# Patient Record
Sex: Male | Born: 1975 | Race: White | Hispanic: No | Marital: Married | Smoking: Former smoker
Health system: Southern US, Community
[De-identification: ages and names within clinical notes are randomized; demographics above are authoritative.]

## PROBLEM LIST (undated history)

## (undated) DIAGNOSIS — K219 Gastro-esophageal reflux disease without esophagitis: Secondary | ICD-10-CM

## (undated) DIAGNOSIS — Z6841 Body Mass Index (BMI) 40.0 and over, adult: Secondary | ICD-10-CM

## (undated) DIAGNOSIS — I319 Disease of pericardium, unspecified: Secondary | ICD-10-CM

## (undated) DIAGNOSIS — G4733 Obstructive sleep apnea (adult) (pediatric): Secondary | ICD-10-CM

## (undated) HISTORY — PX: PERICARDIAL WINDOW: SHX2213

## (undated) HISTORY — DX: Morbid (severe) obesity due to excess calories: E66.01

## (undated) HISTORY — DX: Gastro-esophageal reflux disease without esophagitis: K21.9

## (undated) HISTORY — DX: Disease of pericardium, unspecified: I31.9

## (undated) HISTORY — PX: OTHER SURGICAL HISTORY: SHX169

## (undated) HISTORY — DX: Body Mass Index (BMI) 40.0 and over, adult: Z684

## (undated) HISTORY — DX: Obstructive sleep apnea (adult) (pediatric): G47.33

---

## 2000-12-21 ENCOUNTER — Emergency Department (HOSPITAL_COMMUNITY): Admission: EM | Admit: 2000-12-21 | Discharge: 2000-12-21 | Payer: Self-pay | Admitting: Emergency Medicine

## 2000-12-21 ENCOUNTER — Encounter: Payer: Self-pay | Admitting: Emergency Medicine

## 2006-02-22 ENCOUNTER — Emergency Department (HOSPITAL_COMMUNITY): Admission: EM | Admit: 2006-02-22 | Discharge: 2006-02-23 | Payer: Self-pay | Admitting: Emergency Medicine

## 2006-09-02 ENCOUNTER — Inpatient Hospital Stay (HOSPITAL_COMMUNITY): Admission: EM | Admit: 2006-09-02 | Discharge: 2006-09-07 | Payer: Self-pay | Admitting: Emergency Medicine

## 2007-11-20 ENCOUNTER — Encounter: Payer: Self-pay | Admitting: Emergency Medicine

## 2007-11-20 ENCOUNTER — Encounter (INDEPENDENT_AMBULATORY_CARE_PROVIDER_SITE_OTHER): Payer: Self-pay | Admitting: Cardiology

## 2007-11-21 ENCOUNTER — Encounter: Payer: Self-pay | Admitting: Cardiothoracic Surgery

## 2007-11-21 ENCOUNTER — Ambulatory Visit: Payer: Self-pay | Admitting: Thoracic Surgery (Cardiothoracic Vascular Surgery)

## 2007-11-21 ENCOUNTER — Inpatient Hospital Stay (HOSPITAL_COMMUNITY): Admission: AD | Admit: 2007-11-21 | Discharge: 2007-11-24 | Payer: Self-pay | Admitting: Cardiology

## 2007-11-21 ENCOUNTER — Encounter (INDEPENDENT_AMBULATORY_CARE_PROVIDER_SITE_OTHER): Payer: Self-pay | Admitting: Cardiology

## 2007-11-30 ENCOUNTER — Ambulatory Visit: Payer: Self-pay | Admitting: Cardiothoracic Surgery

## 2007-12-05 ENCOUNTER — Ambulatory Visit: Payer: Self-pay | Admitting: Oncology

## 2007-12-07 ENCOUNTER — Ambulatory Visit: Payer: Self-pay | Admitting: Cardiothoracic Surgery

## 2007-12-07 ENCOUNTER — Encounter: Admission: RE | Admit: 2007-12-07 | Discharge: 2007-12-07 | Payer: Self-pay | Admitting: Cardiothoracic Surgery

## 2007-12-12 LAB — CBC WITH DIFFERENTIAL/PLATELET
BASO%: 0.4 % (ref 0.0–2.0)
EOS%: 6.8 % (ref 0.0–7.0)
HCT: 41.6 % (ref 38.7–49.9)
LYMPH%: 25.9 % (ref 14.0–48.0)
MCH: 28.5 pg (ref 28.0–33.4)
MCHC: 33.3 g/dL (ref 32.0–35.9)
MONO#: 0.5 10*3/uL (ref 0.1–0.9)
NEUT%: 58.9 % (ref 40.0–75.0)
Platelets: 227 10*3/uL (ref 145–400)
RBC: 4.85 10*6/uL (ref 4.20–5.71)
WBC: 6.7 10*3/uL (ref 4.0–10.0)

## 2007-12-15 LAB — COMPREHENSIVE METABOLIC PANEL
ALT: 35 U/L (ref 0–53)
AST: 20 U/L (ref 0–37)
Alkaline Phosphatase: 73 U/L (ref 39–117)
CO2: 24 mEq/L (ref 19–32)
Creatinine, Ser: 0.89 mg/dL (ref 0.40–1.50)
Sodium: 140 mEq/L (ref 135–145)
Total Bilirubin: 0.4 mg/dL (ref 0.3–1.2)
Total Protein: 7.3 g/dL (ref 6.0–8.3)

## 2007-12-15 LAB — IMMUNOFIXATION ELECTROPHORESIS
IgA: 216 mg/dL (ref 68–378)
IgG (Immunoglobin G), Serum: 1030 mg/dL (ref 694–1618)
IgM, Serum: 78 mg/dL (ref 60–263)
Total Protein, Serum Electrophoresis: 7.3 g/dL (ref 6.0–8.3)

## 2007-12-15 LAB — KAPPA/LAMBDA LIGHT CHAINS: Kappa free light chain: 1.2 mg/dL (ref 0.33–1.94)

## 2007-12-21 ENCOUNTER — Ambulatory Visit (HOSPITAL_COMMUNITY): Admission: RE | Admit: 2007-12-21 | Discharge: 2007-12-21 | Payer: Self-pay | Admitting: Oncology

## 2009-08-29 ENCOUNTER — Inpatient Hospital Stay (HOSPITAL_COMMUNITY): Admission: EM | Admit: 2009-08-29 | Discharge: 2009-09-05 | Payer: Self-pay | Admitting: Emergency Medicine

## 2009-08-31 ENCOUNTER — Encounter (INDEPENDENT_AMBULATORY_CARE_PROVIDER_SITE_OTHER): Payer: Self-pay | Admitting: Internal Medicine

## 2009-09-12 ENCOUNTER — Ambulatory Visit (HOSPITAL_COMMUNITY): Admission: RE | Admit: 2009-09-12 | Discharge: 2009-09-12 | Payer: Self-pay | Admitting: Internal Medicine

## 2009-09-19 ENCOUNTER — Ambulatory Visit (HOSPITAL_COMMUNITY): Admission: RE | Admit: 2009-09-19 | Discharge: 2009-09-19 | Payer: Self-pay | Admitting: Surgery

## 2009-09-26 ENCOUNTER — Ambulatory Visit (HOSPITAL_COMMUNITY): Admission: RE | Admit: 2009-09-26 | Discharge: 2009-09-26 | Payer: Self-pay | Admitting: Diagnostic Radiology

## 2009-10-02 ENCOUNTER — Ambulatory Visit (HOSPITAL_COMMUNITY): Admission: RE | Admit: 2009-10-02 | Discharge: 2009-10-02 | Payer: Self-pay | Admitting: Psychology

## 2009-10-14 ENCOUNTER — Ambulatory Visit (HOSPITAL_COMMUNITY): Admission: RE | Admit: 2009-10-14 | Discharge: 2009-10-14 | Payer: Self-pay | Admitting: Surgery

## 2009-10-24 ENCOUNTER — Ambulatory Visit (HOSPITAL_COMMUNITY): Admission: RE | Admit: 2009-10-24 | Discharge: 2009-10-24 | Payer: Self-pay | Admitting: Surgery

## 2009-12-05 ENCOUNTER — Encounter: Admission: RE | Admit: 2009-12-05 | Discharge: 2009-12-05 | Payer: Self-pay | Admitting: Gastroenterology

## 2010-03-29 ENCOUNTER — Encounter: Payer: Self-pay | Admitting: Cardiothoracic Surgery

## 2010-05-24 LAB — DIFFERENTIAL
Basophils Absolute: 0 10*3/uL (ref 0.0–0.1)
Basophils Absolute: 0 10*3/uL (ref 0.0–0.1)
Basophils Relative: 0 % (ref 0–1)
Eosinophils Absolute: 0 10*3/uL (ref 0.0–0.7)
Eosinophils Absolute: 0 10*3/uL (ref 0.0–0.7)
Eosinophils Absolute: 0 10*3/uL (ref 0.0–0.7)
Eosinophils Relative: 0 % (ref 0–5)
Eosinophils Relative: 0 % (ref 0–5)
Lymphocytes Relative: 6 % — ABNORMAL LOW (ref 12–46)
Lymphocytes Relative: 9 % — ABNORMAL LOW (ref 12–46)
Lymphs Abs: 1 10*3/uL (ref 0.7–4.0)
Lymphs Abs: 1.2 10*3/uL (ref 0.7–4.0)
Lymphs Abs: 1.7 10*3/uL (ref 0.7–4.0)
Monocytes Absolute: 1.2 10*3/uL — ABNORMAL HIGH (ref 0.1–1.0)
Monocytes Relative: 4 % (ref 3–12)
Neutro Abs: 14.2 10*3/uL — ABNORMAL HIGH (ref 1.7–7.7)
Neutro Abs: 17.1 10*3/uL — ABNORMAL HIGH (ref 1.7–7.7)
Neutrophils Relative %: 87 % — ABNORMAL HIGH (ref 43–77)
Neutrophils Relative %: 89 % — ABNORMAL HIGH (ref 43–77)
Neutrophils Relative %: 91 % — ABNORMAL HIGH (ref 43–77)

## 2010-05-24 LAB — BASIC METABOLIC PANEL
BUN: 10 mg/dL (ref 6–23)
BUN: 14 mg/dL (ref 6–23)
BUN: 9 mg/dL (ref 6–23)
BUN: 9 mg/dL (ref 6–23)
CO2: 24 mEq/L (ref 19–32)
CO2: 25 mEq/L (ref 19–32)
CO2: 26 mEq/L (ref 19–32)
CO2: 26 mEq/L (ref 19–32)
Calcium: 8.2 mg/dL — ABNORMAL LOW (ref 8.4–10.5)
Calcium: 8.3 mg/dL — ABNORMAL LOW (ref 8.4–10.5)
Calcium: 8.9 mg/dL (ref 8.4–10.5)
Chloride: 100 mEq/L (ref 96–112)
Chloride: 101 mEq/L (ref 96–112)
Chloride: 102 mEq/L (ref 96–112)
Chloride: 102 mEq/L (ref 96–112)
Creatinine, Ser: 0.87 mg/dL (ref 0.4–1.5)
Creatinine, Ser: 0.88 mg/dL (ref 0.4–1.5)
Creatinine, Ser: 1 mg/dL (ref 0.4–1.5)
Creatinine, Ser: 1 mg/dL (ref 0.4–1.5)
Creatinine, Ser: 1.18 mg/dL (ref 0.4–1.5)
GFR calc Af Amer: >60 mL/min (ref >60)
GFR calc Af Amer: >60 mL/min (ref >60)
GFR calc Af Amer: >60 mL/min (ref >60)
GFR calc Af Amer: >60 mL/min (ref >60)
GFR calc non Af Amer: >60 mL/min (ref >60)
GFR calc non Af Amer: >60 mL/min (ref >60)
Glucose, Bld: 107 mg/dL — ABNORMAL HIGH (ref 70–99)
Potassium: 3.5 mEq/L (ref 3.5–5.1)
Potassium: 3.8 mEq/L (ref 3.5–5.1)
Sodium: 132 mEq/L — ABNORMAL LOW (ref 135–145)
Sodium: 135 mEq/L (ref 135–145)

## 2010-05-24 LAB — CBC
HCT: 39.9 % (ref 39.0–52.0)
HCT: 40 % (ref 39.0–52.0)
Hemoglobin: 13.5 g/dL (ref 13.0–17.0)
Hemoglobin: 13.9 g/dL (ref 13.0–17.0)
Hemoglobin: 15.9 g/dL (ref 13.0–17.0)
MCH: 29.7 pg (ref 26.0–34.0)
MCH: 30 pg (ref 26.0–34.0)
MCH: 30.4 pg (ref 26.0–34.0)
MCH: 30.7 pg (ref 26.0–34.0)
MCH: 30.7 pg (ref 26.0–34.0)
MCH: 30.8 pg (ref 26.0–34.0)
MCHC: 33.4 g/dL (ref 30.0–36.0)
MCHC: 34.5 g/dL (ref 30.0–36.0)
MCV: 88.2 fL (ref 78.0–100.0)
MCV: 89 fL (ref 78.0–100.0)
MCV: 89.1 fL (ref 78.0–100.0)
MCV: 89.8 fL (ref 78.0–100.0)
Platelets: 170 10*3/uL (ref 150–400)
Platelets: 180 10*3/uL (ref 150–400)
Platelets: 211 10*3/uL (ref 150–400)
Platelets: 212 10*3/uL (ref 150–400)
Platelets: 240 10*3/uL (ref 150–400)
RBC: 4.39 MIL/uL (ref 4.22–5.81)
RBC: 4.51 MIL/uL (ref 4.22–5.81)
RBC: 4.52 MIL/uL (ref 4.22–5.81)
RBC: 4.92 MIL/uL (ref 4.22–5.81)
RDW: 13 % (ref 11.5–15.5)
RDW: 13.6 % (ref 11.5–15.5)
RDW: 14 % (ref 11.5–15.5)
WBC: 13.1 10*3/uL — ABNORMAL HIGH (ref 4.0–10.5)
WBC: 14.2 10*3/uL — ABNORMAL HIGH (ref 4.0–10.5)
WBC: 15.6 10*3/uL — ABNORMAL HIGH (ref 4.0–10.5)
WBC: 19.7 10*3/uL — ABNORMAL HIGH (ref 4.0–10.5)
WBC: 21.5 10*3/uL — ABNORMAL HIGH (ref 4.0–10.5)

## 2010-05-24 LAB — URINALYSIS, ROUTINE W REFLEX MICROSCOPIC
Glucose, UA: NEGATIVE mg/dL
Specific Gravity, Urine: 1.029 (ref 1.005–1.030)
pH: 6.5 (ref 5.0–8.0)

## 2010-05-24 LAB — COMPREHENSIVE METABOLIC PANEL
AST: 21 U/L (ref 0–37)
CO2: 25 mEq/L (ref 19–32)
Calcium: 9.1 mg/dL (ref 8.4–10.5)
Creatinine, Ser: 1.08 mg/dL (ref 0.4–1.5)
GFR calc Af Amer: >60 mL/min (ref >60)
GFR calc non Af Amer: >60 mL/min (ref >60)

## 2010-05-24 LAB — CULTURE, BLOOD (ROUTINE X 2)

## 2010-05-24 LAB — URINE CULTURE
Colony Count: NO GROWTH
Culture: NO GROWTH

## 2010-05-24 LAB — CULTURE, ROUTINE-ABSCESS

## 2010-05-24 LAB — TSH: TSH: 1.965 u[IU]/mL (ref 0.350–4.500)

## 2010-07-21 NOTE — Discharge Summary (Signed)
Ralph Cervantes, Ralph Cervantes                ACCOUNT NO.:  [id number]   MEDICAL RECORD NO.:  [id number]          PATIENT TYPE:  INP   LOCATION:  2020                         FACILITY:  [facility]   PHYSICIAN:  Cristy Hilts. Jacinto Halim, MD       DATE OF BIRTH:  August 19, 1975   DATE OF ADMISSION:  11/21/2007  DATE OF DISCHARGE:  11/24/2007                               DISCHARGE SUMMARY   DISCHARGE DIAGNOSES:  1. Large pericardial effusion by CT scan with right ventricular and      right atrial collapse by echocardiogram.  2. Status post attempted pericardiocentesis on November 21, 2007, by      Dr. Julieanne Manson.  3. Status post subxiphoid pericardial window by Dr. Tyrone Sage on      November 21, 2007, with drainage of 1500 mL of bloody pericardial      fluid.  4. Morbid obesity.   LABS:  Sodium 138, potassium 3.9, BUN 10, creatinine 0.85, rheumatoid  factor was less than 20, hemoglobin 11.8, hematocrit 35.7, WBC 7.2, and  platelets 221.  Pericardial fluid; AFB with smear showed no acid fast  bacilli; however, the cultures will be examined for 6 weeks.  Culture of  pericardial tissue.  AFB with smear showed no acid fast bacilli seen in  the culture but will be examined for 6 weeks.  Gram stain on pericardial  tissue showed no WBCs, no organisms.  Gram stain on the pericardial  fluid showed moderate WBCs present both PMN and mononuclear, no organism  seen, abundant RBCs.  ESR was 26.  TSH 1.34.  BNP was 34.  Cardiac  markers were negative.  CK-MB and troponin were negative.  Fungus  culture of the pericardial fibrin showed no yeast or fungal element  seen.  Culture in progress for 4 weeks.  Fungus culture of the  pericardial fluid also showed no yeast or fungal, culture in progress  for 4 weeks.  Tissue cultures on the pericardial tissue showed no growth  x2 days.  Culture on the pericardial fluid also showed no growth.  Pathology report showed hemorrhagic and fibrinous pericarditis with  reactive changes.   Cytology showed pericardial fluid ThinPrep and cell  block.  No malignant cells.  Radiology, chest x-ray on November 24, 2007, showed cardiomegaly, mild residual central vascular congestion  without convincing edema, no acute infiltrate, no pneumothorax.  CT of  the chest and abdomen showed a small pericardial effusion, trace  bilateral effusions, and associated compressive atelectasis.  Abdomen  showed no acute intraabdominal finding.  Chest x-ray on November 21, 2007, showed low lung volumes with lower lobe collapse, left greater  than the right and stable cardiomyopathy.  Echo on November 21, 2007,  transesophageal showed LV was small, left ventricular wall thickness was  moderately increased, no left atrial thrombus.  There was a large free-  flowing fibrin stranding pericardial effusion circumferential to the  heart, effusion dimensions 50 mm on the left and right lateral.  There  was marked right ventricular chamber collapse.  There was moderate left  ventricular chamber collapse.  He also  had a 2-D echo on November 21, 2007, that showed a moderate right atrial chamber collapse and moderate  right ventricular chamber collapse with a large pericardial effusion  circumferential to the heart.   HOSPITAL COURSE:  [name] was seen in the emergency room by Dr.  Lynnea Ferrier on November 20, 2007, with a 6- to 31-month history of lethargy  and weight gain about 60 pounds.  He apparently had gone on vacation the  week prior to his admission.  He apparently was unable to lie flat.  He  had orthopnea and nausea and vomiting the day before his admission.  He  was seen, and a transthoracic echo was done.  He was found to have a  large pericardial effusion.  His CT also showed a large pericardial  effusion.  The case was discussed with Dr. Tresa Endo.  It was decided that  he should be attempted a pericardiocentesis on November 21, 2007.  On  November 21, 2007, pericardiocentesis was attempted  by Dr. Julieanne Manson, took 4 attempts to enter the pericardium with a needle, unable  to aspirate any fluid.  Thus, it was decided he would need a pericardial  window.  CVTS was consulted.  He was seen initially by Dr. Dorris Fetch  and then by Dr. Tyrone Sage, and pericardiocentesis was discussed, and he  was willing to proceed on that.  Thus, he had a 1500 mL of bloody  pericardial fluid drained by subxiphoid pericardial window.  He did have  a mediastinal tube postsurgery which drained out about 200 mL more.  Thus, he had a total of about 1700 mL of drainage from his pericardial  effusion.  His mediastinal drain was removed on November 23, 2007.  On  November 24, 2007, he was seen by Dr. Jacinto Halim, considered stable for  discharge home.  He had been placed on ibuprofen 600 mg 3 times a day  and Protonix 40 mg once a day.  We will continue that for approximately  2 weeks until he sees Dr. Lynnea Ferrier in followup.  He will undergo 2-D  echocardiogram next week and then follow up with Dr. Lynnea Ferrier.  He will  also have an appointment to be seen to have sutures removed at Dr.  Dennie Maizes office on November 30, 2007.  He will follow up with Dr.  Tyrone Sage on December 18, 2007.   DISCHARGE MEDICATIONS:  1. Ibuprofen 600 mg 2 times a day x2 weeks.  2. Protonix 40 mg a day x2 weeks.  3. OxyContin 5 mg, he may use for pain, 1-2 every 4-6 hours as needed.      Lezlie Octave, N.P.      Cristy Hilts. Jacinto Halim, MD  Electronically Signed    BB/MEDQ  D:  11/24/2007  T:  11/25/2007  Job:  [id number]   cc:   Sheliah Plane, MD  Merlene Laughter. Renae Gloss, M.D.

## 2010-07-21 NOTE — Consult Note (Signed)
NAMEALHASSAN, EVERINGHAM                ACCOUNT NO.:  [id number]   MEDICAL RECORD NO.:  [id number]          PATIENT TYPE:  INP   LOCATION:  2302                         FACILITY:  [facility]   PHYSICIAN:  Salvatore Decent. Dorris Fetch, M.D.DATE OF BIRTH:  20-Dec-1975   DATE OF CONSULTATION:  11/21/2007  DATE OF DISCHARGE:                                 CONSULTATION   Mr. Woolsey is a 35 year old previous healthy gentleman who presents with  shortness of breath.  He does note over the past 6-8 months, he has had  a history of lethargy and had gained about 60 pounds.  He had been on  vacation at [facility] last week, and while there noted some right  upper quadrant abdominal pain and some chest discomfort, and he also  began having shortness of breath with exertion, which eventually  progressed to shortness of breath at rest and orthopnea.  The last 2  nights while at the beach, he had to sit up while sleeping.  He saw his  medical doctor, and a chest CT was performed which showed a large  pericardial effusion.  He came in today for a subxiphoid  pericardiocentesis by Dr. Clarene Duke.  A catheter was placed, but the fluid  could not be successfully evacuated.  The patient is tachycardic but  does have good blood pressure and no overt cardiac tamponade.   PAST MEDICAL HISTORY:  Unremarkable.   MEDICATIONS:  None.   ALLERGIES:  None.   FAMILY HISTORY:  Father has esophageal cancer.   SOCIAL HISTORY:  He is married with 1 child.  He is a Naval architect.  He  did smoke until 6 months ago.   REVIEW OF SYSTEMS:  See HPI, otherwise negative.   PHYSICAL EXAMINATION:  GENERAL:  Mr. Valent is an obese, 35 year old  white male in mild respiratory distress.  VITAL SIGNS:  Blood pressure is 150/80, pulse is 120 and regular, and  respirations are 20.  NEUROLOGIC:  He is alert and oriented x3 with no focal deficits.  HEENT:  Unremarkable.  NECK:  Supple without thyromegaly or adenopathy.  He does have jugular  venous distention.  CARDIAC:  Sounds are difficult to auscultate.  LUNGS:  Clear anteriorly with no wheezing.  ABDOMEN:  Soft and nontender.  EXTREMITIES:  Warm.  There is no clubbing or cyanosis.   LABORATORY DATA:  His white count is 9.7 and hematocrit 37.  BUN and  creatinine are 18 and 1.05.  Sodium 136 and potassium 4.4.  CT of the  chest was done at an outside institution.  We are trying to localize  that at the present time.  ECG shows pulsus alternans and sinus  tachycardia.   Transthoracic echo showed RV inspiratory collapse and respiratory  variation.  There was minimal right atrial collapse, and there was a  large pericardial effusion.   IMPRESSION:  Mr. Philipp is a 35 year old gentleman who presents with  recent onset shortness of breath.  He has been found to have a large  pericardial effusion, and attempts to drain percutaneously are  successful.  He needs a pericardial  window for diagnostic and  therapeutic purposes.  I discussed with him the indications, risks,  benefits, and alternatives.  He understands and accepts those risks and  agrees to proceed.  He understands the risks including bleeding,  infection, as well as risks associated with general anesthesia as well  as general risks of surgery including DVT or PE.  He does understand  there is a very slight risk of cardiac injury.  He understands and  accepts these risks and agrees to proceed.  We will post him for surgery  with Dr. Sheliah Plane who is on call today but is currently  unavailable.  If he were to develop any signs of instability, we could  proceed more emergently.  Otherwise, he would be done as the second case  today at approximately noon to 1 o'clock.      Salvatore Decent Dorris Fetch, M.D.  Electronically Signed     SCH/MEDQ  D:  11/21/2007  T:  11/21/2007  Job:  [id number]   cc:   Sheliah Plane, MD  Thereasa Solo. Little, M.D.

## 2010-07-21 NOTE — Assessment & Plan Note (Signed)
OFFICE VISIT   Ralph Cervantes, Ralph Cervantes  DOB:  15-Nov-1975                                        December 07, 2007  CHART #:  [id number]   The patient returns to the office today after an urgent subxiphoid  pericardial window and drainage of 1500 mL of bloody pericardial  effusion on November 21, 2007.  The patient now is feeling well without  any sequelae.  He is interested in returning to work next week.  The  final pathology on the pericardium pleural fluid and cytology did not  reveal any evidence of malignancy.  Although the patient does have  appointment with Oncology for further evaluation to ensure there is no  underlying malignancy even with a negative path may have been a cause of  pericardial effusion.  The patient does note today that his family  history is positive for ITP, and his father who subsequently died of  esophageal cancer at age 82.   PHYSICAL EXAMINATION:  VITAL SIGNS:  His blood pressure 140/94, pulse is  94, respiratory rate is 20, and O2 sat is 94% on room air.  He had some small amount of serous drainage from his wound last week and  returns today for recheck of the wounds.  The subxiphoid incision is now  healing well without any evidence of infection or drainage.  LUNGS:  Clear bilaterally.   Followup chest x-ray shows the small pleural effusions, which had been  present and have resolved.  He has no infiltrates or effusions.   I have encouraged him to check with his primary medical doctor and make  sure of his flu vaccinations up-to-date.  I have not made him a specific  return appointment to see me, but would be glad to see him at his  cardiologist's request.   Sheliah Plane, MD  Electronically Signed   EG/MEDQ  D:  12/07/2007  T:  12/07/2007  Job:  [id number]   cc:   Merlene Laughter. Renae Gloss, M.D.  Ritta Slot, MD

## 2010-07-21 NOTE — Assessment & Plan Note (Signed)
OFFICE VISIT   Ralph Cervantes, Ralph Cervantes  DOB:  06-25-1975                                        November 30, 2007  CHART #:  [id number]   The patient returns to the office today for followup on his wound check  on his subxiphoid pericardial window done approximately 1 week ago.  He  returns today because of some drainage from the midportion of his  incision.  He has had no fever or chills.  Overall, he feels well and is  actually interested in returning to work next week.   PHYSICAL EXAMINATION:  VITAL SIGNS:  His blood pressure 144/91, pulse is  106, respiratory rate is 18, and O2 sats 93%.  SKIN:  The Dermabond on his incision is gradually peeling off.  There is  no erythema.  There is no drainage.  There is a very small amount of  serous drainage spotting from the midportion of incision.  His wound was  clean.  It is intact and without obvious infection.   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  He was given  instructions in wound care and we will plan to see him back in 1 week.  He is aware if he has increasing drainage or any fevers, chills, or  erythema of the wound to contact us immediately.   Sheliah Plane, MD  Electronically Signed   EG/MEDQ  D:  11/30/2007  T:  12/01/2007  Job:  [id number]   cc:   Ritta Slot, MD

## 2010-07-21 NOTE — Op Note (Signed)
Ralph Cervantes, Ralph Cervantes                ACCOUNT NO.:  [id number]   MEDICAL RECORD NO.:  [id number]          PATIENT TYPE:  INP   LOCATION:  2302                         FACILITY:  [facility]   PHYSICIAN:  Sheliah Plane, MD    DATE OF BIRTH:  1975-10-29   DATE OF PROCEDURE:  11/21/2007  DATE OF DISCHARGE:                               OPERATIVE REPORT   PREOPERATIVE DIAGNOSIS:  Pericardial tamponade and large pericardial  effusion.   POSTOPERATIVE DIAGNOSIS:  Pericardial tamponade and large pericardial  effusion with drainage.   PROCEDURE PERFORMED:  Subxiphoid pericardial window and drainage of  pericardial effusion approximately 1500 mL of bloody effusion.   SURGEON:  Sheliah Plane, MD   FIRST ASSISTANT:  Theda Belfast, [place]   BRIEF HISTORY:  The patient is a 35 year old male who presents with  several-week history of increasing heart failure symptoms and difficulty  sleeping.  Enlarged cardiac silhouette on chest x-ray.  Echocardiogram  showed large pericardial effusion with tamponade physiology.  Cardiology  attempted pericardiocentesis which was successful possibly related to  the patient's very large size of almost 390 pounds.  Subxiphoid  pericardial window was recommended urgently after failed  pericardiocentesis.  The patient agreed and signed informed consent.   DESCRIPTION OF PROCEDURE:  The patient underwent general endotracheal  anesthesia without incident.  Transesophageal echo probe was placed and  confirmed the diagnosis.  The chest was prepped with Betadine and draped  in the usual sterile manner.  An incision was made over the xiphoid  process.  Dissection was carried down through the deep subcutaneous  tissue.  The xiphoid was excised.  The lower sternum was elevated with a  retractor and with some difficulty the pericardium was identified.  Pericardium was opened and 1500 mL of bloody pericardial effusion were  evacuated.  The TEE confirmed complete removal of  the fluid.  A Blake  drain was placed inferiorly in the pericardium and brought out through a  separate stab wound.  Portion of the pericardium and proteinaceous  debris on the epicardial surface were submitted to pathology.  Cultures  were obtained.  A large amount of pericardial fluid was submitted for  cytology.  The fascia was closed with interrupted 0 Vicryl, subcutaneous  tissue, and running 2-0 Vicryl and a 3-0 subcuticular stitch in the skin  edges.  Dry dressings were  applied.  Sponge and needle count was reported as correct after  completion of the procedure.  The patient tolerated the procedure  without obvious complication and was extubated and transferred to the  recovery room for postoperative care.      Sheliah Plane, MD  Electronically Signed     EG/MEDQ  D:  11/22/2007  T:  11/23/2007  Job:  [id number]   cc:   Thereasa Solo. Little, M.D.  Ritta Slot, MD

## 2010-07-21 NOTE — H&P (Signed)
Ralph Cervantes, Ralph Cervantes                ACCOUNT NO.:  [id number]   MEDICAL RECORD NO.:  [id number]          PATIENT TYPE:  INP   LOCATION:  1424                         FACILITY:  [facility]   PHYSICIAN:  Ollen Gross. Vernell Morgans, M.D. DATE OF BIRTH:  [birth year]   DATE OF ADMISSION:  09/02/2006  DATE OF DISCHARGE:                              HISTORY & PHYSICAL   HISTORY OF PRESENT ILLNESS:  Ralph Cervantes is a 34 year old, white male who  presented to the emergency department today with abdominal pain that has  been lasting the last 2 days.  He states the pain is mostly in his right  lower quadrant.  It has not really been associated with any nausea or  vomiting, but he has run fevers to 103 and has had some diarrhea over  the last couple of days.  He otherwise denies any fevers, chills, chest  pain, shortness of breath or dysuria.  His other review of systems are  unremarkable.   PAST MEDICAL HISTORY:  Obesity.   PAST SURGICAL HISTORY:  None.   MEDICATIONS:  None.   ALLERGIES:  No known drug allergies.   SOCIAL HISTORY:  He denies use of alcohol or tobacco products.   FAMILY HISTORY:  Noncontributory.   PHYSICAL EXAMINATION:  VITAL SIGNS:  Temperature 103.5, blood pressure  138/91, pulse 130.  GENERAL:  Well-developed, well-nourished, obese, white male in no acute  distress.  SKIN:  Warm and dry, no jaundice.  HEENT:  Extraocular movements intact.  Pupils equal round and reactive  to light.  Sclerae nonicteric.  LUNGS:  Clear bilaterally with no use of accessory or respiratory  muscles.  HEART:  Regular rate and rhythm with an impulse in left chest.  ABDOMEN:  Soft.  He has only focal tenderness in the right lower  quadrant, but no generalized peritonitis with slight guarding just in  the right lower quadrant.  No palpable mass or hepatosplenomegaly.  His  abdomen is obese.  EXTREMITIES:  No clubbing, cyanosis or edema with good strength in his  arms and legs.  NEUROLOGIC:  Alert and  oriented x3 with no unstable anxiety or  depression.   LABORATORY DATA AND X-RAY FINDINGS:  White count 20,100.   In reviewing his CT scan with the radiologist, it does appear as though  he has some sigmoid diverticulitis with a contained perforation with a  small bit of air outside the colon wall.  No abscess, no inflammation  around the appendix.   ASSESSMENT/PLAN:  This is a 35 year old, white male with what appears to  be complicated sigmoid diverticulitis with a small contained  perforation.  Given his lack of abdominal pain, I suspect that he may  improve on antibiotics without any urgent surgery.  I have talked to him  about this and he is in agreement with this treatment plan.  We will  plan to admit him for bowel rest and start him on Cipro and Flagyl.  Will follow him closely including serial exams and white counts and  temperatures.  If he does improve, than he may need further colon work  up and possible surgery in 6-8 weeks when the infection as settled down.  If he does not improve, then he may need exploration, which would  probably include a colostomy.  I have talked to him about all this  treatment plan and he is in agreement and understands.      Ollen Gross. Vernell Morgans, M.D.  Electronically Signed     PST/MEDQ  D:  09/02/2006  T:  09/03/2006  Job:  [id number]

## 2010-07-21 NOTE — Cardiovascular Report (Signed)
NAMEROBEL, WUERTZ                ACCOUNT NO.:  [id number]   MEDICAL RECORD NO.:  [id number]          PATIENT TYPE:  INP   LOCATION:  2302                         FACILITY:  [facility]   PHYSICIAN:  Thereasa Solo. Little, M.D. DATE OF BIRTH:  [birth year]   DATE OF PROCEDURE:  11/21/2007  DATE OF DISCHARGE:                            CARDIAC CATHETERIZATION   INDICATIONS FOR TEST:  This obese 35 year old male presented with  increasing shortness of breath.  He had been sitting up to sleep for  several days and has had marked breathlessness for several weeks.  A CT  of his chest performed last night at [facility] showed a  massive pericardial effusion.  Echocardiography showed right ventricular  and right atrial collapse, but despite this his pressure was 150/100.   PROCEDURE NOTE:  After obtaining informed consent, the patient was  prepped and draped in the usual sterile fashion exposing the subxiphoid  area.  Following 1% Xylocaine, a long pericardiocentesis needle was  advanced.  It took 4 attempts to enter the pericardium and it was at the  hub of the needle.  Once the pericardium was entered and fluid was  removed, a long J-wire was placed into the pericardium.  A 5-French  pigtail catheter was then advanced over the guidewire into the  pericardium.  Once the pigtail catheter was in place, it would not  aspirate.  I could flush with no difficulty but could not aspirate  through the catheter.  The guidewire was then re-advanced through the  pigtail catheter into the pericardium and 8-French dilator was used and  then a 6-French straight catheter with multiple holes were placed over  the wire.  Despite this stiffer catheter that appeared to be in the  pericardium by fluoroscopy, I was still unable to aspirate.   A 3 mL of contrast was injected.  There was some myocardial stain, but  there was also contrast noted in the pericardial effusion.  Despite  this, I could not aspirate.   The catheter was then removed.  The patient  will need a pericardial window and I have talked to CVTS.  He is  hemodynamically stable.  He is having no more shortness of breath  following the procedure than he did before the procedure.  His blood  pressure is in the 130s-150 systolic and his heart rate is sinus tach at  a rate of about 120-125.   A portable chest x-ray is pending at this time.           ______________________________  Thereasa Solo Little, M.D.     ABL/MEDQ  D:  11/21/2007  T:  11/21/2007  Job:  [id number]   cc:   Ritta Slot, MD  Merlene Laughter. Renae Gloss, M.D.  CVTS  Cath Lab

## 2010-07-24 NOTE — Discharge Summary (Signed)
NAMESENG, LARCH                ACCOUNT NO.:  [id number]   MEDICAL RECORD NO.:  [id number]          PATIENT TYPE:  INP   LOCATION:  1424                         FACILITY:  [facility]   PHYSICIAN:  Ollen Gross. Vernell Morgans, M.D. DATE OF BIRTH:  07/29/1975   DATE OF ADMISSION:  09/02/2006  DATE OF DISCHARGE:  09/07/2006                               DISCHARGE SUMMARY   Mr. Ralph Cervantes is a 35 year old white male who presented with lower abdominal  pain and fever, diarrhea.  He had a white count of 20,000.  He had a CT  scan that showed sigmoid diverticulitis with a small area of sort of  microperforation.  His temperature was 103.5.  He did not, however, have  guarding or peritonitis.  He was started on broad-spectrum antibiotic  therapy and bowel rest, and he improved dramatically over the next  several days.  We had changed his antibiotics at one point to Avelox  because of some reaction to the Cipro and Flagyl, and he tolerated this  as well.  We slowly advanced his diet until he was on a low-residue diet  and on July 2 he was ready for discharge home.  His activity is as  tolerated.   MEDICATIONS:  He was given a prescription for Avelox for antibiotics.   FOLLOW-UP:  Dr. Carolynne Edouard in 2 weeks.   DIET:  A low-residue diet.   ACTIVITY:  Advance as tolerated.  Follow up with Dr. Carolynne Edouard in 2 weeks.      Ollen Gross. Vernell Morgans, M.D.  Electronically Signed     PST/MEDQ  D:  09/27/2006  T:  09/27/2006  Job:  [id number]

## 2010-12-07 LAB — BODY FLUID CULTURE: Culture: NO GROWTH

## 2010-12-07 LAB — COMPREHENSIVE METABOLIC PANEL
ALT: 87 — ABNORMAL HIGH
AST: 46 — ABNORMAL HIGH
Albumin: 2.7 — ABNORMAL LOW
Alkaline Phosphatase: 58
BUN: 10
CO2: 30
Calcium: 8.3 — ABNORMAL LOW
Chloride: 102
Creatinine, Ser: 0.83
GFR calc Af Amer: >60
GFR calc non Af Amer: >60
Glucose, Bld: 114 — ABNORMAL HIGH
Potassium: 3.9
Sodium: 138
Total Bilirubin: 1.1
Total Protein: 5.7 — ABNORMAL LOW

## 2010-12-07 LAB — GLUCOSE, CAPILLARY: Glucose-Capillary: 95

## 2010-12-07 LAB — POCT I-STAT 7, (LYTES, BLD GAS, ICA,H+H)
Bicarbonate: 27.1 — ABNORMAL HIGH
HCT: 33 — ABNORMAL LOW
Hemoglobin: 11.2 — ABNORMAL LOW
Patient temperature: 37.3
pCO2 arterial: 48.7 — ABNORMAL HIGH
pH, Arterial: 7.356
pO2, Arterial: 125 — ABNORMAL HIGH

## 2010-12-07 LAB — CBC
HCT: 33.1 — ABNORMAL LOW
HCT: 35.7 — ABNORMAL LOW
Hemoglobin: 11.1 — ABNORMAL LOW
Hemoglobin: 11.8 — ABNORMAL LOW
MCHC: 33
MCHC: 33.5
MCV: 88.5
Platelets: 221
RBC: 3.76 — ABNORMAL LOW
RBC: 4.03 — ABNORMAL LOW
RDW: 13.2
RDW: 13.6
WBC: 7.2

## 2010-12-07 LAB — TYPE AND SCREEN
ABO/RH(D): B NEG
Antibody Screen: NEGATIVE

## 2010-12-07 LAB — ANTI-NEUTROPHIL ANTIBODY

## 2010-12-07 LAB — BASIC METABOLIC PANEL
CO2: 25
CO2: 30
Chloride: 104
Creatinine, Ser: 0.85
GFR calc Af Amer: >60
GFR calc Af Amer: >60
GFR calc non Af Amer: >60
Glucose, Bld: 104 — ABNORMAL HIGH
Potassium: 3.3 — ABNORMAL LOW
Sodium: 137
Sodium: 138

## 2010-12-07 LAB — POCT I-STAT 3, ART BLOOD GAS (G3+)
Acid-Base Excess: 4 — ABNORMAL HIGH
O2 Saturation: 97
TCO2: 30
pO2, Arterial: 95

## 2010-12-07 LAB — AFB CULTURE WITH SMEAR (NOT AT ARMC)

## 2010-12-07 LAB — GRAM STAIN: Gram Stain: NONE SEEN

## 2010-12-07 LAB — TISSUE CULTURE: Culture: NO GROWTH

## 2010-12-07 LAB — ANA: Anti Nuclear Antibody(ANA): NEGATIVE

## 2010-12-07 LAB — RHEUMATOID FACTOR: Rhuematoid fact SerPl-aCnc: <20

## 2010-12-07 LAB — FUNGUS CULTURE W SMEAR: Fungal Smear: NONE SEEN

## 2010-12-07 LAB — APTT: aPTT: 34

## 2010-12-07 LAB — SEDIMENTATION RATE: Sed Rate: 26 — ABNORMAL HIGH

## 2010-12-07 LAB — EXTRACTABLE NUCLEAR ANTIGEN ANTIBODY
ENA SM Ab Ser-aCnc: <0.2 AI (ref <1.0)
SSA (Ro) (ENA) Antibody, IgG: <0.2 AI (ref <1.0)
ds DNA Ab: 1 IU/mL (ref <5)

## 2010-12-09 LAB — CBC
MCHC: 33.3
MCV: 88.2
Platelets: 258
WBC: 9.7

## 2010-12-09 LAB — URINALYSIS, ROUTINE W REFLEX MICROSCOPIC
Glucose, UA: NEGATIVE
Ketones, ur: 15 — AB
Leukocytes, UA: NEGATIVE
Nitrite: NEGATIVE
Protein, ur: 100 — AB
pH: 6

## 2010-12-09 LAB — COMPREHENSIVE METABOLIC PANEL
ALT: 146 — ABNORMAL HIGH
AST: 102 — ABNORMAL HIGH
Albumin: 3.4 — ABNORMAL LOW
Calcium: 8.9
Creatinine, Ser: 1.05
GFR calc Af Amer: >60
Sodium: 136

## 2010-12-09 LAB — DIFFERENTIAL
Eosinophils Relative: 1
Lymphocytes Relative: 20
Lymphs Abs: 1.9
Monocytes Absolute: 1.1 — ABNORMAL HIGH
Monocytes Relative: 12

## 2010-12-09 LAB — T4, FREE: Free T4: 1.62

## 2010-12-09 LAB — CK TOTAL AND CKMB (NOT AT ARMC)
CK, MB: 1.1
Relative Index: 0.6
Total CK: 176

## 2010-12-09 LAB — TROPONIN I: Troponin I: 0.01

## 2010-12-09 LAB — URINE MICROSCOPIC-ADD ON

## 2010-12-09 LAB — TSH: TSH: 1.434

## 2010-12-09 LAB — POCT CARDIAC MARKERS: Troponin i, poc: <0.05

## 2010-12-22 LAB — CBC
HCT: 36.5 — ABNORMAL LOW
Hemoglobin: 12.9 — ABNORMAL LOW
MCHC: 35.3
MCV: 88.2
RBC: 4 — ABNORMAL LOW
RDW: 13.5
WBC: 7.8

## 2010-12-22 LAB — DIFFERENTIAL
Basophils Absolute: 0
Basophils Relative: 0
Eosinophils Absolute: 0
Eosinophils Relative: 1
Lymphs Abs: 1.3
Monocytes Absolute: 1.1 — ABNORMAL HIGH
Monocytes Relative: 8
Neutro Abs: 5.7
Neutrophils Relative %: 74

## 2010-12-22 LAB — BASIC METABOLIC PANEL
CO2: 28
Chloride: 103
Creatinine, Ser: 0.87
GFR calc Af Amer: >60
Glucose, Bld: 106 — ABNORMAL HIGH
Potassium: 3.8
Sodium: 136
Sodium: 137

## 2010-12-23 LAB — CBC
HCT: 36.4 — ABNORMAL LOW
HCT: 42.3
Hemoglobin: 14.1
Hemoglobin: 15
MCHC: 34
MCHC: 34.5
MCHC: 35.4
MCV: 87.7
MCV: 88.8
Platelets: 168
Platelets: 206
RBC: 4.09 — ABNORMAL LOW
RBC: 4.82
RDW: 12.7
RDW: 13.2
WBC: 21.2 — ABNORMAL HIGH
WBC: 9

## 2010-12-23 LAB — BASIC METABOLIC PANEL
BUN: 10
BUN: 11
BUN: 13
CO2: 25
CO2: 26
Calcium: 8.5
Calcium: 8.6
Calcium: 9.1
Chloride: 99
Creatinine, Ser: 0.87
Creatinine, Ser: 1.04
GFR calc Af Amer: >60
GFR calc Af Amer: >60
GFR calc non Af Amer: >60
GFR calc non Af Amer: >60
GFR calc non Af Amer: >60
Glucose, Bld: 107 — ABNORMAL HIGH
Glucose, Bld: 122 — ABNORMAL HIGH
Glucose, Bld: 131 — ABNORMAL HIGH
Potassium: 3.8
Potassium: 3.8
Sodium: 135

## 2010-12-23 LAB — DIFFERENTIAL
Basophils Absolute: 0
Basophils Absolute: 0
Basophils Relative: 0
Basophils Relative: 0
Basophils Relative: 0
Eosinophils Absolute: 0
Eosinophils Absolute: 0
Eosinophils Absolute: 0
Eosinophils Relative: 0
Eosinophils Relative: 0
Lymphocytes Relative: 12
Lymphs Abs: 1.4
Monocytes Absolute: 1.6 — ABNORMAL HIGH
Monocytes Absolute: 1.7 — ABNORMAL HIGH
Monocytes Relative: 7
Monocytes Relative: 8
Monocytes Relative: 8
Neutro Abs: 17.4 — ABNORMAL HIGH
Neutro Abs: 17.6 — ABNORMAL HIGH
Neutro Abs: 6.9
Neutrophils Relative %: 77

## 2010-12-23 LAB — URINALYSIS, ROUTINE W REFLEX MICROSCOPIC
Ketones, ur: 40 — AB
Protein, ur: >300 — AB
Urobilinogen, UA: 1

## 2010-12-23 LAB — URINE MICROSCOPIC-ADD ON

## 2011-10-11 ENCOUNTER — Ambulatory Visit: Payer: BC Managed Care – PPO

## 2011-10-11 ENCOUNTER — Ambulatory Visit (INDEPENDENT_AMBULATORY_CARE_PROVIDER_SITE_OTHER): Payer: BC Managed Care – PPO | Admitting: Family Medicine

## 2011-10-11 VITALS — BP 136/92 | HR 77 | Temp 98.7°F | Resp 14 | Ht 68.5 in | Wt 377.0 lb

## 2011-10-11 DIAGNOSIS — R109 Unspecified abdominal pain: Secondary | ICD-10-CM

## 2011-10-11 DIAGNOSIS — R631 Polydipsia: Secondary | ICD-10-CM

## 2011-10-11 DIAGNOSIS — R300 Dysuria: Secondary | ICD-10-CM

## 2011-10-11 DIAGNOSIS — R358 Other polyuria: Secondary | ICD-10-CM

## 2011-10-11 DIAGNOSIS — R3589 Other polyuria: Secondary | ICD-10-CM

## 2011-10-11 LAB — POCT CBC
Granulocyte percent: 66.1 % (ref 37–80)
HCT, POC: 51.7 % (ref 43.5–53.7)
Hemoglobin: 15.8 g/dL (ref 14.1–18.1)
Lymph, poc: 2.2 (ref 0.6–3.4)
MCH, POC: 28.4 pg (ref 27–31.2)
MCHC: 30.6 g/dL — AB (ref 31.8–35.4)
MCV: 92.9 fL (ref 80–97)
MID (cbc): 0.6 (ref 0–0.9)
MPV: 8.8 fL (ref 0–99.8)
POC Granulocyte: 5.6 (ref 2–6.9)
POC LYMPH PERCENT: 26.5 %L (ref 10–50)
POC MID %: 7.4 %M (ref 0–12)
Platelet Count, POC: 260 10*3/uL (ref 142–424)
RBC: 5.56 M/uL (ref 4.69–6.13)
RDW, POC: 13.9 %
WBC: 8.4 10*3/uL (ref 4.6–10.2)

## 2011-10-11 LAB — POCT UA - MICROSCOPIC ONLY
Casts, Ur, LPF, POC: NEGATIVE
Crystals, Ur, HPF, POC: NEGATIVE
Epithelial cells, urine per micros: NEGATIVE
Yeast, UA: NEGATIVE

## 2011-10-11 LAB — POCT URINALYSIS DIPSTICK
Bilirubin, UA: NEGATIVE
Blood, UA: NEGATIVE
Glucose, UA: NEGATIVE
Ketones, UA: NEGATIVE
Leukocytes, UA: NEGATIVE
Nitrite, UA: NEGATIVE
Protein, UA: NEGATIVE
Spec Grav, UA: >=1.03
Urobilinogen, UA: 0.2
pH, UA: 6

## 2011-10-11 LAB — POCT GLYCOSYLATED HEMOGLOBIN (HGB A1C): Hemoglobin A1C: 5.3

## 2011-10-11 NOTE — Progress Notes (Signed)
[facility]:  Office Visit  Chief Complaint:  Chief Complaint  Patient presents with  . Abdominal Pain    dysuria, pressure  incomplete emptying 3 days    HPI: Ralph Cervantes is a 36 y.o. male who complains of  abdominal pressure, and dysuria and incomplete emptying x 3 days. H/o colon perforation with colon abscess. He is worried he is getting that again. Last time indicated through xray with free gas in it.   Colonoscopy was normal 2008   Past Medical History  Diagnosis Date  . GERD (gastroesophageal reflux disease)   . Pericarditis    Past Surgical History  Procedure Date  . Colon aspiration     abscess in colon aspirated   . Pericardial window    History   Social History  . Marital Status: Married    Spouse Name: N/A    Number of Children: N/A  . Years of Education: N/A   Social History Main Topics  . Smoking status: Former Games developer  . Smokeless tobacco: None  . Alcohol Use: Yes  . Drug Use: No  . Sexually Active: None   Other Topics Concern  . None   Social History Narrative  . None   Family History  Problem Relation Age of Onset  . Cancer Father    No Known Allergies Prior to Admission medications   Medication Sig Start Date End Date Taking? Authorizing Provider  doxycycline (DORYX) 100 MG DR capsule Take 100 mg by mouth daily.   Yes Historical Provider, MD  omeprazole (PRILOSEC) 40 MG capsule Take 40 mg by mouth daily.   Yes Historical Provider, MD  testosterone (ANDROGEL) 50 MG/5GM GEL Place 5 g onto the skin daily.   Yes Historical Provider, MD     ROS: The patient denies fevers, chills, night sweats, unintentional weight loss, chest pain, palpitations, wheezing, dyspnea on exertion, nausea, vomiting, abdominal pain, dysuria, hematuria, melena, numbness, weakness, or tingling.   All other systems have been reviewed and were otherwise negative with the exception of those mentioned in the HPI and as above.    PHYSICAL  EXAM: Filed Vitals:   10/11/11 1329  BP: 136/92  Pulse: 77  Temp: 98.7 F (37.1 C)  Resp: 14   Filed Vitals:   10/11/11 1329  Height: 5' 8.5" (1.74 m)  Weight: 377 lb (171.006 kg)   Body mass index is 56.49 kg/(m^2).  General: Alert, no acute distress, morbidly obese HEENT:  Normocephalic, atraumatic, oropharynx patent.  Cardiovascular:  Regular rate and rhythm, no rubs murmurs or gallops.  No Carotid bruits, radial pulse intact. No pedal edema.  Respiratory: Clear to auscultation bilaterally.  No wheezes, rales, or rhonchi.  No cyanosis, no use of accessory musculature GI: No organomegaly, abdomen is soft and non-tender, positive bowel sounds.  No masses. + Umbilical hernai, + obese pannus Skin: No rashes. Neurologic: Facial musculature symmetric. Psychiatric: Patient is appropriate throughout our interaction. Lymphatic: No cervical lymphadenopathy Musculoskeletal: Gait intact.   LABS: Results for orders placed in visit on 10/11/11  POCT CBC      Component Value Range   WBC 8.4  4.6 - 10.2 K/uL   Lymph, poc 2.2  0.6 - 3.4   POC LYMPH PERCENT 26.5  10 - 50 %L   MID (cbc) 0.6  0 - 0.9   POC MID % 7.4  0 - 12 %M   POC Granulocyte 5.6  2 - 6.9   Granulocyte percent 66.1  37 -  80 %G   RBC 5.56  4.69 - 6.13 M/uL   Hemoglobin 15.8  14.1 - 18.1 g/dL   HCT, POC 16.1  09.6 - 53.7 %   MCV 92.9  80 - 97 fL   MCH, POC 28.4  27 - 31.2 pg   MCHC 30.6 (*) 31.8 - 35.4 g/dL   RDW, POC 04.5     Platelet Count, POC 260  142 - 424 K/uL   MPV 8.8  0 - 99.8 fL  POCT UA - MICROSCOPIC ONLY      Component Value Range   WBC, Ur, HPF, POC 0-2     RBC, urine, microscopic 0-2     Bacteria, U Microscopic trace     Mucus, UA 3+     Epithelial cells, urine per micros neg     Crystals, Ur, HPF, POC neg     Casts, Ur, LPF, POC neg     Yeast, UA neg    POCT URINALYSIS DIPSTICK      Component Value Range   Color, UA amber     Clarity, UA clear     Glucose, UA neg     Bilirubin, UA neg      Ketones, UA neg     Spec Grav, UA >=1.030     Blood, UA neg     pH, UA 6.0     Protein, UA neg     Urobilinogen, UA 0.2     Nitrite, UA neg     Leukocytes, UA Negative    POCT GLYCOSYLATED HEMOGLOBIN (HGB A1C)      Component Value Range   Hemoglobin A1C 5.3       EKG/XRAY:   Primary read interpreted by Dr. Conley Rolls at [facility]. No free air No obstruction No pneumothorax/infiltrates   ASSESSMENT/PLAN: Encounter Diagnoses  Name Primary?  . Abdominal pain Yes  . Dysuria   . Polydipsia   . Polyuria     Patient wanted confirmation that he is doing well, he is anxious about repeat episode of free gas due to perforated colon. Encourage fluid intake, cranberry juice.  Await CMP.  F/u prn   Belinda Schlichting PHUONG, DO 10/11/2011 2:31 PM

## 2011-10-12 ENCOUNTER — Telehealth: Payer: Self-pay | Admitting: Family Medicine

## 2011-10-12 LAB — COMPREHENSIVE METABOLIC PANEL WITH GFR
ALT: 30 U/L (ref 0–53)
AST: 20 U/L (ref 0–37)
Albumin: 4.3 g/dL (ref 3.5–5.2)
BUN: 18 mg/dL (ref 6–23)
Calcium: 9.4 mg/dL (ref 8.4–10.5)
Chloride: 104 meq/L (ref 96–112)
Potassium: 4.4 meq/L (ref 3.5–5.3)

## 2011-10-12 LAB — COMPREHENSIVE METABOLIC PANEL
Alkaline Phosphatase: 72 U/L (ref 39–117)
CO2: 29 mEq/L (ref 19–32)
Creat: 0.96 mg/dL (ref 0.50–1.35)
Glucose, Bld: 90 mg/dL (ref 70–99)
Sodium: 141 mEq/L (ref 135–145)
Total Bilirubin: 0.5 mg/dL (ref 0.3–1.2)
Total Protein: 7.3 g/dL (ref 6.0–8.3)

## 2011-10-12 NOTE — Telephone Encounter (Signed)
Spoke with  him re: test results

## 2012-05-19 IMAGING — CT CT ABD-PELV W/ CM
2 of 5 series · 17 of 46 positions shown, 19 images · IV contrast (agent unspecified)
Comparison: 09/12/2009

CLINICAL DATA: Follow-up mesenteric abscess.  Perforated
diverticulitis.

CT ABDOMEN AND PELVIS WITH CONTRAST
TECHNIQUE: Multidetector CT imaging of the abdomen and pelvis was
performed following the standard protocol during bolus
administration of intravenous contrast.
Contrast: 129 ml Cmnipaque-0PP and oral contrast

[Series 2: rtn a/p with xxl · axial · 0.98mm/px · z∈[-530,-90]mm · 14 of 100 slices shown, 16 images]
[im 6/100  soft-tissue]
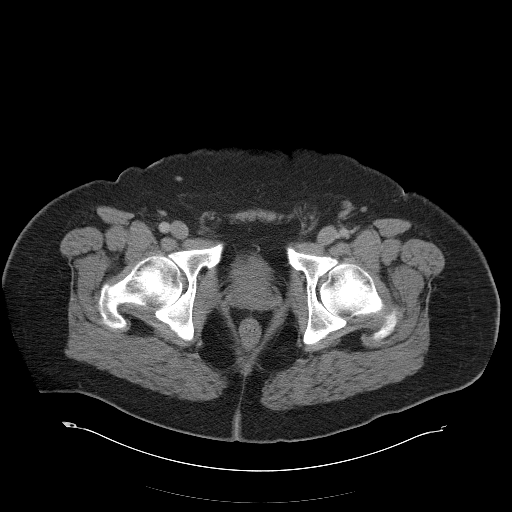
[im 6/100  bone]
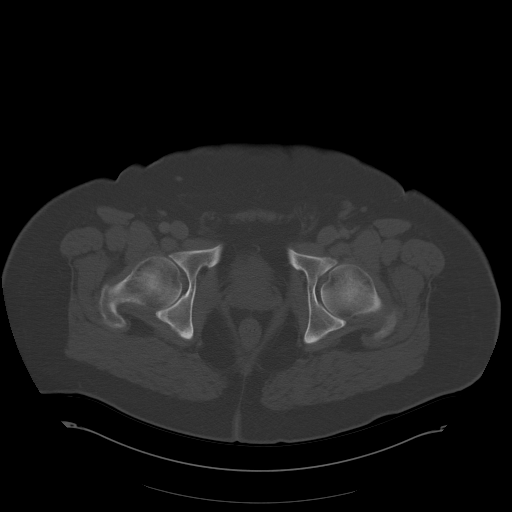
[im 11/100  soft-tissue]
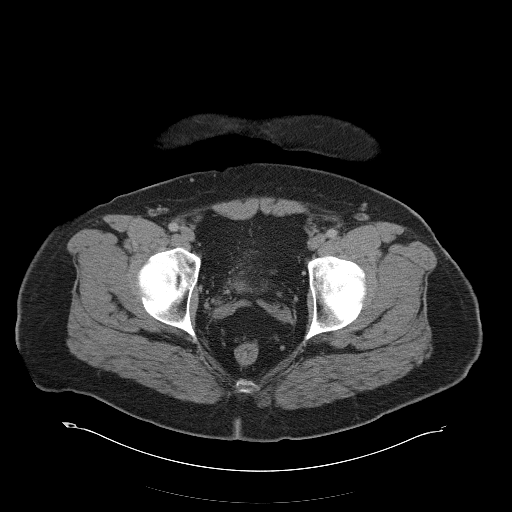
[im 21/100  soft-tissue]
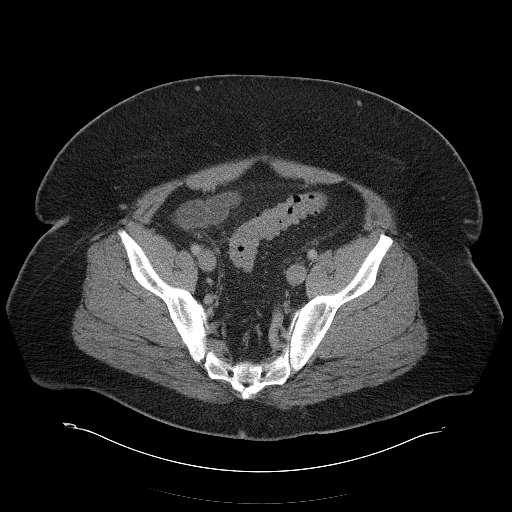
[im 27/100  soft-tissue]
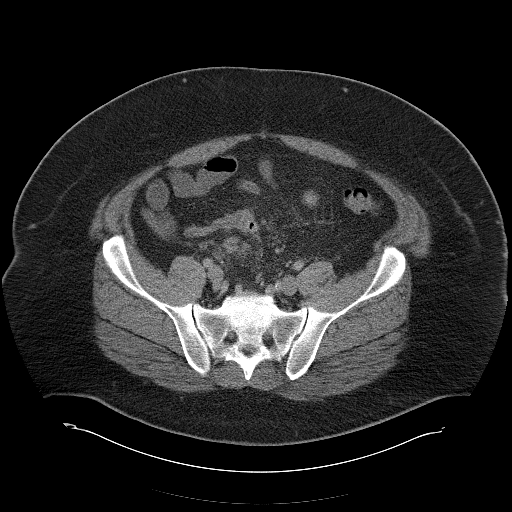
[im 32/100  soft-tissue]
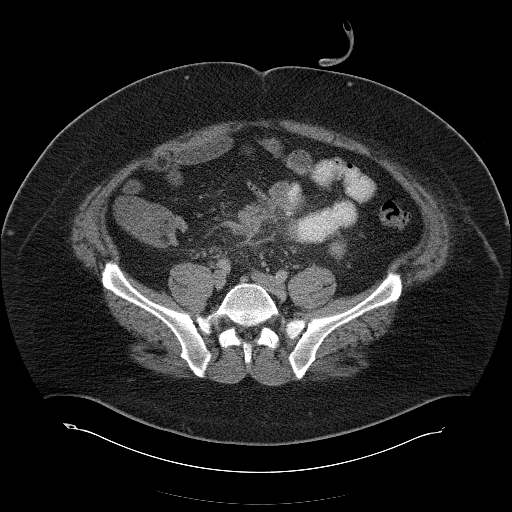
[im 42/100  soft-tissue]
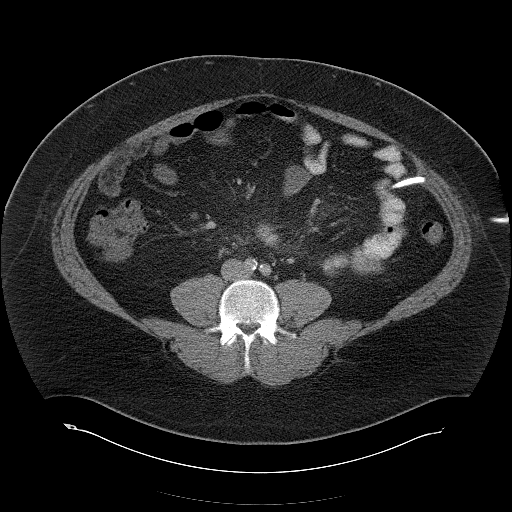
[im 47/100  soft-tissue]
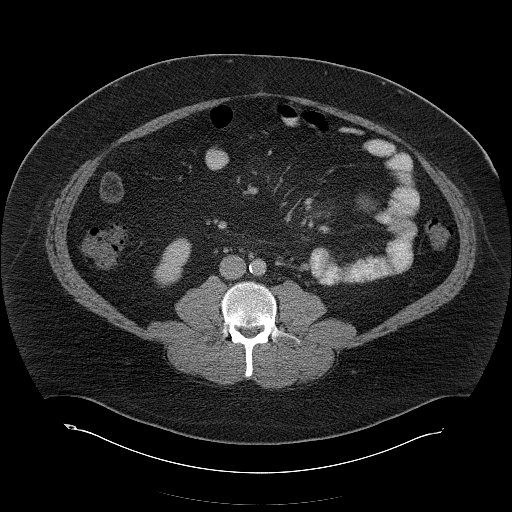
[im 53/100  soft-tissue]
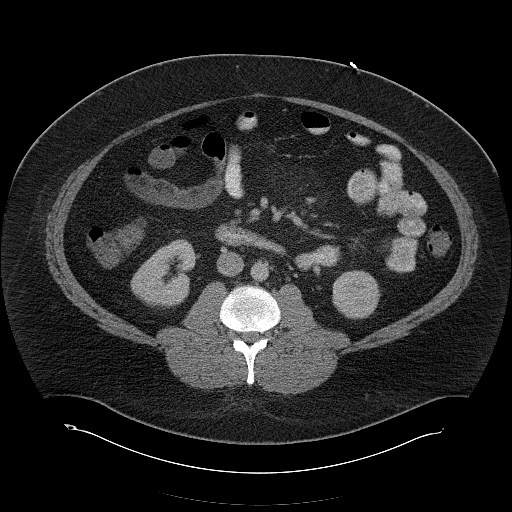
[im 58/100  soft-tissue]
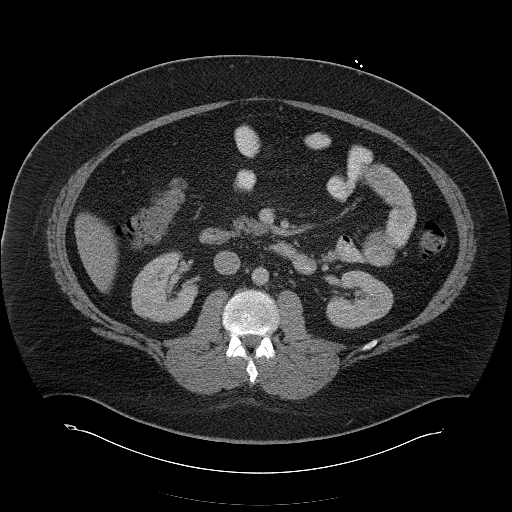
[im 58/100  bone]
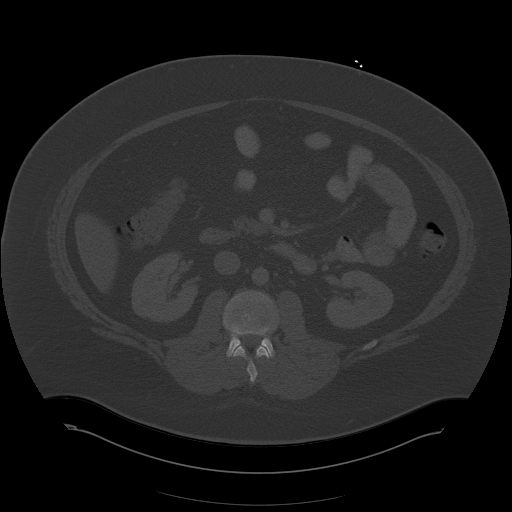
[im 68/100  soft-tissue]
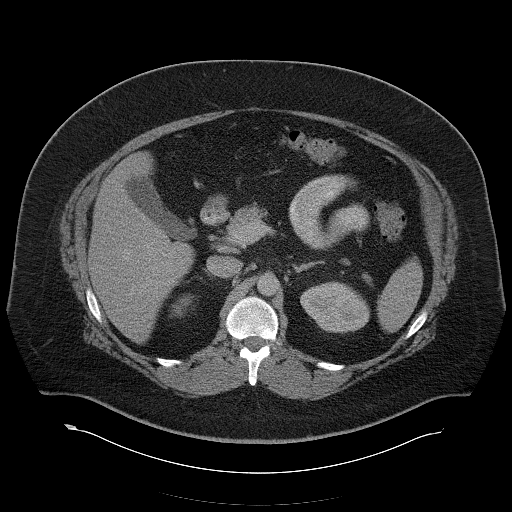
[im 73/100  soft-tissue]
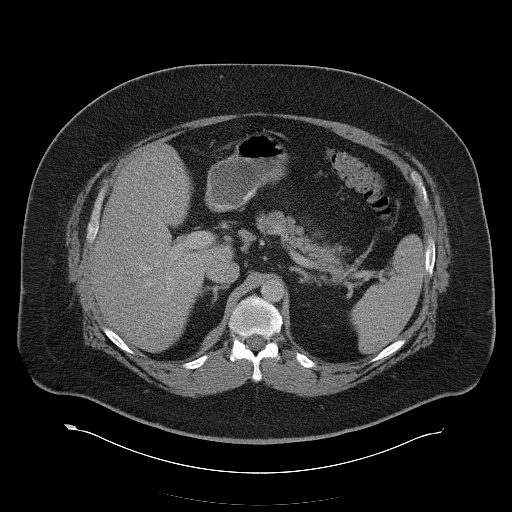
[im 79/100  soft-tissue]
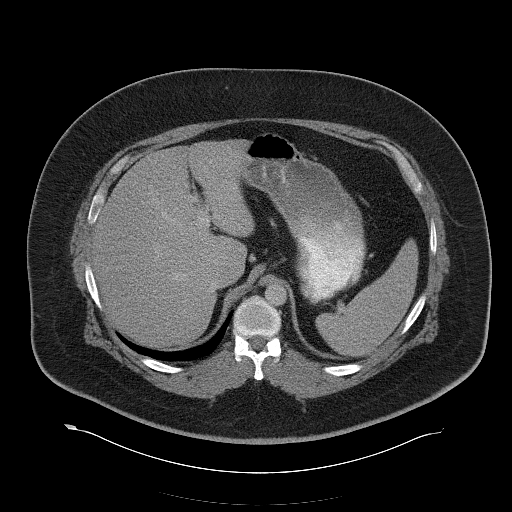
[im 89/100  soft-tissue]
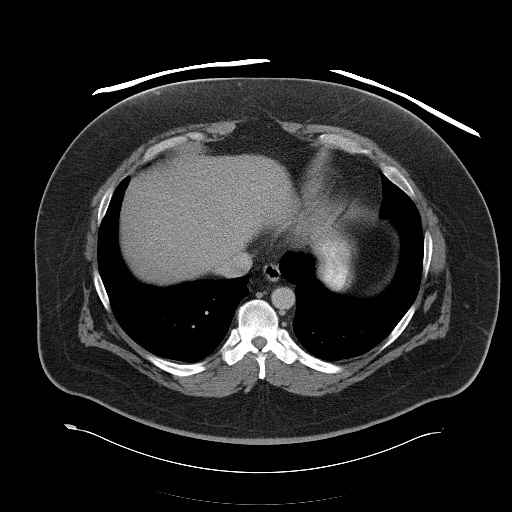
[im 94/100  soft-tissue]
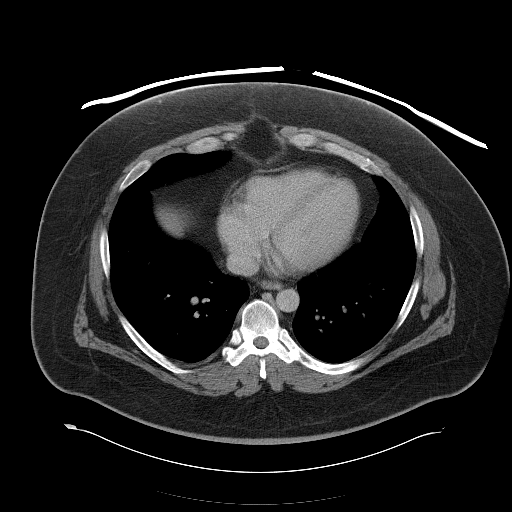

[Series 602: <mpr thick range> · coronal · 0.98mm/px · 3 of 109 slices shown]
[im 37/109  soft-tissue]
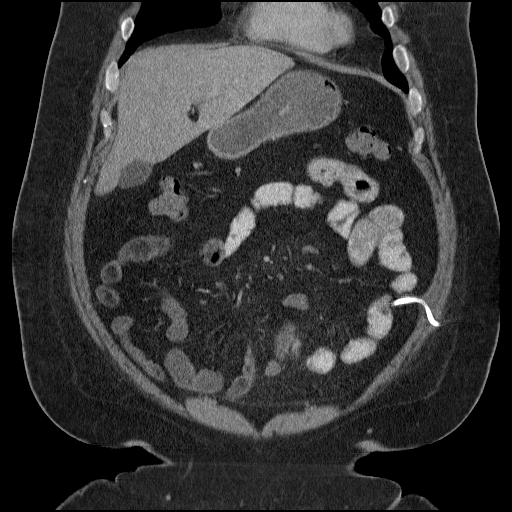
[im 49/109  soft-tissue]
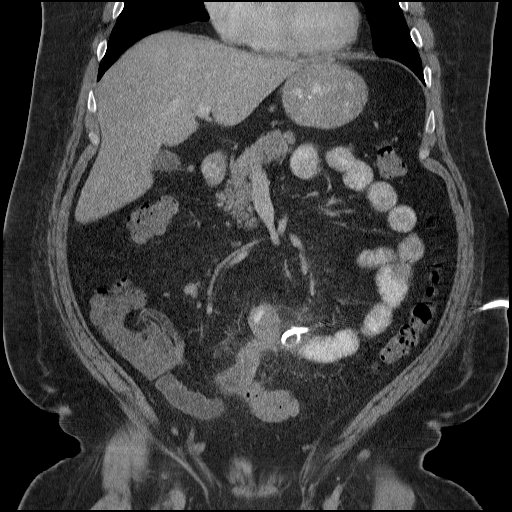
[im 61/109  soft-tissue]
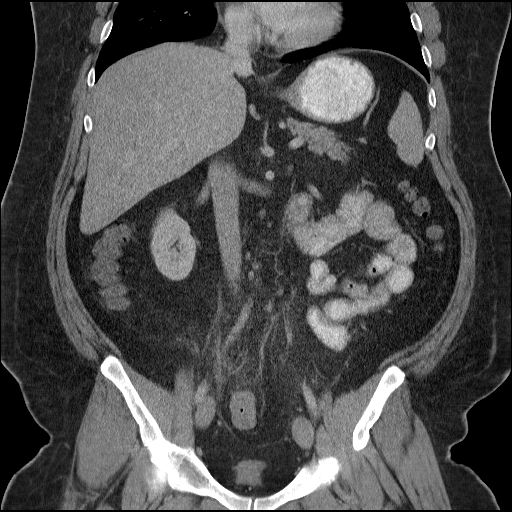

[17 of 46 positions shown; findings below may reference images not displayed]

FINDINGS: Left-sided percutaneous drainage catheter remains in
stable position.  The previously seen fluid collection within the
mesentery at dislocation has now resolved.  Mild residual
inflammatory changes seen in the adjacent mesenteric fat and there
is mild residual wall thickening involving the adjacent small bowel
and sigmoid colon, however this shows some improvement compared to
prior study.  There is no evidence of bowel obstruction, and no
other abscess or ascites identified.

Mild hepatic steatosis noted.  No liver masses are identified.  The
other abdominal parenchymal organs are normal appearance.  There is
no evidence of hydronephrosis.
IMPRESSION: 1.  Resolution of mesenteric abscess at site a percutaneous
drainage catheters since previous study.
2.  Decreased adjacent mesenteric inflammatory changes and
thickening of adjacent small bowel loops and sigmoid colon.  No
evidence of bowel obstruction or other acute findings.

## 2012-06-23 IMAGING — RF DG SINUS / FISTULA TRACT / ABSCESSOGRAM
3 series · 5 of 5 positions shown · IV contrast (agent unspecified)
Comparison: Abscessogram 10/14/2009, 10/02/2009, and 09/26/2009.

CLINICAL DATA: Indwelling mid abdominal abscess catheter since
09/04/2009, with previous fistulogram this demonstrating a
persistent sinus tract to the small bowel.

ABSCESSOGRAM (INJECTION OF INDWELLING ABSCESS CATHETER) 10/24/2009:
TECHNIQUE: Contrast was slowly injected into the indwelling abscess
catheter. Spot film images were obtained.
Contrast:  Approximately five ml Pmnipaque-7WW
Fluoroscopy Time: 5.3 minutes, pulsed fluoroscopy.

[Series 1: run · 1 of 1 slices shown (1 of 3)]
[im 1/1]
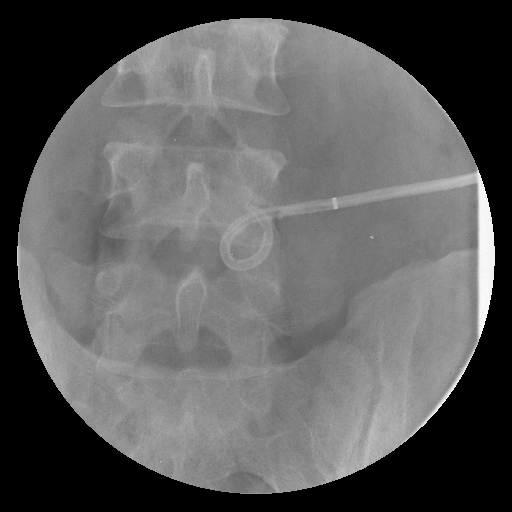

[Series 2: run · 1 of 1 slices shown (2 of 3)]
[im 1/1]
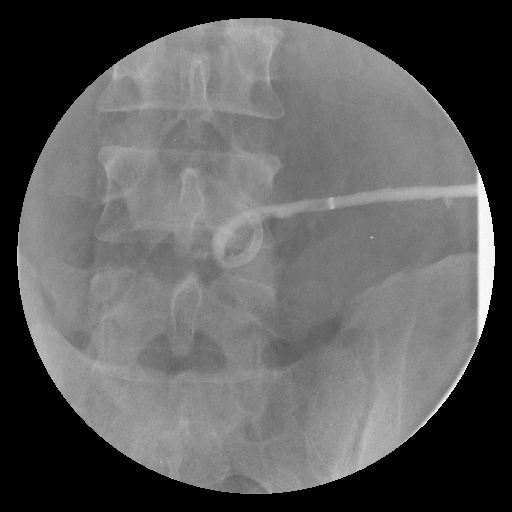

[Series 3: run · 3 of 3 slices shown (3 of 3)]
[im 1/3]
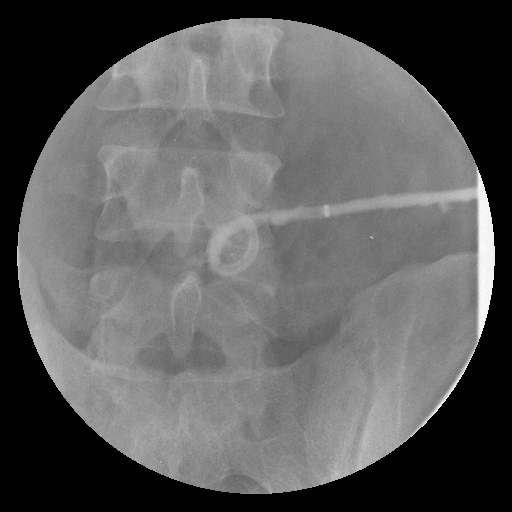
[im 2/3]
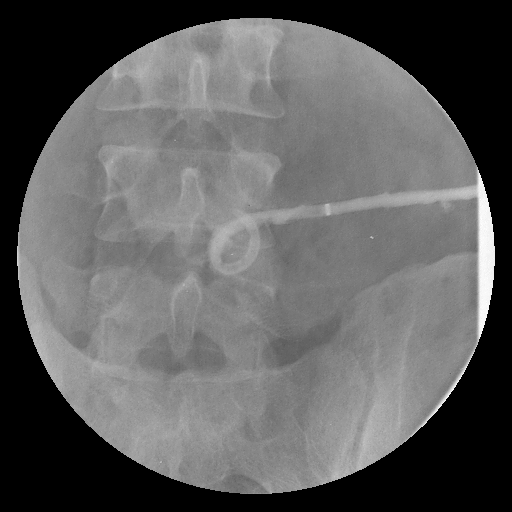
[im 3/3]
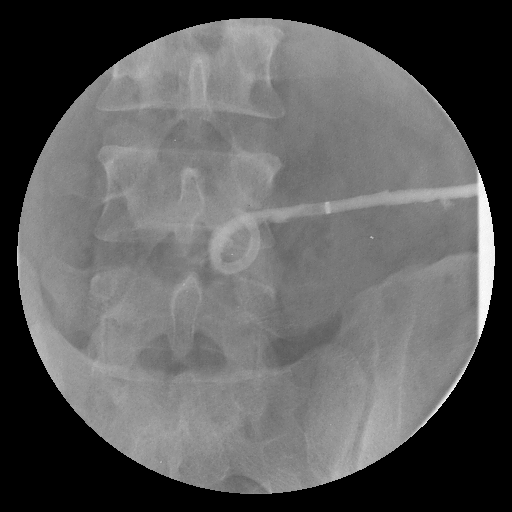

[5 of 5 positions shown; findings below may reference images not displayed]

FINDINGS: Initial attempt at injecting the indwelling catheter with
a 60 ml syringe was unsuccessful.  Therefore, the external portion
of the catheter was cleansed with Betadine and a 0.035 Bentson
guide wire was advanced into the catheter in order to free up any
occlusive debris.  This was performed without difficulty.  I then
injected approximately 5 ml of the contrast using a 10 ml syringe,
and was able to perform the abscessogram.  There is no significant
residual cavity, and much of the contrast tract back along the
catheter tract.  No residual fistula was identified.

For this reason, I elected to remove the catheter.  The skin site
was cleansed with Betadine nine.  The catheter was cut in order to
free the retention strain for the pig tail.  However, the the
patient inhaled deeply, and the catheter migrated into the
subcutaneous tract.  Using approximately 10 ml 1% Lidocaine as
local anesthetic, I used a small curved hemostats to retrieve the
catheter from the subcutaneous tissues, using fluoroscopic
guidance.  The catheter was then easily removed over the Bentson
guide wire without difficulty.  A dressing was applied to the skin
entry site.
IMPRESSION: 1.  No residual fistula and essential elimination of the abscess
cavity.
2.  The catheter was therefore removed as detailed above.

## 2013-01-03 ENCOUNTER — Encounter: Payer: Self-pay | Admitting: Physician Assistant

## 2013-01-03 DIAGNOSIS — L719 Rosacea, unspecified: Secondary | ICD-10-CM | POA: Insufficient documentation

## 2013-01-03 DIAGNOSIS — Z9989 Dependence on other enabling machines and devices: Secondary | ICD-10-CM

## 2013-01-03 DIAGNOSIS — G4733 Obstructive sleep apnea (adult) (pediatric): Secondary | ICD-10-CM | POA: Insufficient documentation

## 2013-01-03 DIAGNOSIS — K219 Gastro-esophageal reflux disease without esophagitis: Secondary | ICD-10-CM

## 2013-04-02 ENCOUNTER — Ambulatory Visit (INDEPENDENT_AMBULATORY_CARE_PROVIDER_SITE_OTHER): Payer: BC Managed Care – PPO | Admitting: General Surgery

## 2013-04-17 ENCOUNTER — Ambulatory Visit (INDEPENDENT_AMBULATORY_CARE_PROVIDER_SITE_OTHER): Payer: BC Managed Care – PPO | Admitting: General Surgery

## 2013-04-17 ENCOUNTER — Encounter (INDEPENDENT_AMBULATORY_CARE_PROVIDER_SITE_OTHER): Payer: Self-pay

## 2013-04-17 ENCOUNTER — Encounter (INDEPENDENT_AMBULATORY_CARE_PROVIDER_SITE_OTHER): Payer: Self-pay | Admitting: General Surgery

## 2013-04-17 DIAGNOSIS — K429 Umbilical hernia without obstruction or gangrene: Secondary | ICD-10-CM

## 2013-04-17 NOTE — Progress Notes (Signed)
Patient ID: Ralph Cervantes, male   DOB: [birth year], 38 y.o.   MRN: [id number]  No chief complaint on file.   HPI Ralph Cervantes is a 38 y.o. male.   HPI  He is referred by Dr. Willey Cervantes for further evaluation and possible repair of an umbilical hernia. This has been present for many years. It is essentially asymptomatic and unless he is touched at the hernia site. No obstructive symptoms. He is interested in weight loss surgery as well. No known family history of hernia.  Past Medical History  Diagnosis Date  . GERD (gastroesophageal reflux disease)   . Pericarditis     Past Surgical History  Procedure Laterality Date  . Colon aspiration      abscess in colon aspirated   . Pericardial window      Family History  Problem Relation Age of Onset  . Cancer Father     Social History History  Substance Use Topics  . Smoking status: Former Research scientist (life sciences)  . Smokeless tobacco: Not on file  . Alcohol Use: Yes    No Known Allergies  Current Outpatient Prescriptions  Medication Sig Dispense Refill  . doxycycline (DORYX) 100 MG DR capsule Take 100 mg by mouth daily.      Marland Kitchen omeprazole (PRILOSEC) 40 MG capsule Take 40 mg by mouth daily.      Marland Kitchen PHENTERMINE HCL PO Take by mouth.      . testosterone (ANDROGEL) 50 MG/5GM GEL Place 5 g onto the skin daily.       No current facility-administered medications for this visit.    Review of Systems Review of Systems  Constitutional: Negative.   Gastrointestinal: Positive for abdominal pain (At the hernia site when touched ).    There were no vitals taken for this visit.  Physical Exam Physical Exam  Constitutional:  Morbidly obese male in no acute distress. He is here with his wife. Height 5 feet 10 inches tall, weight 408 pounds.  HENT:  Head: Normocephalic and atraumatic.  Cardiovascular: Normal rate and regular rhythm.   No murmur heard. Abdominal: Soft. There is no tenderness.  His umbilical bulge that is not completely  reducible. It is nontender. No bowel sounds are heard in the bulge.    Data Reviewed Note from Dr. 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 He also has significant morbid obesity with a BMI of approximately 56. The risk of recurrence after umbilical hernia repair with his BMI is significantly high. He is interested in potential weight loss surgery as well.     Plan    I told him that optimally he would lose weight prior to having the hernia repair because the risk of recurrence is so high.  Thus, I do not recommend repair at this time unless he loses a significant amount of weight. This is with either an open or laparoscopic repair. I spoke with him about diet and exercise. I also spoke with him about going toward the bariatric surgery seminars offered here in town to see if he is interested in that. If the hernia become significantly more symptomatic, I told him we could discuss repair but he does understand the recurrence rate is significantly high.        Ralph Cervantes J 04/17/2013, 2:03 PM

## 2013-04-17 NOTE — Patient Instructions (Signed)
If the hernia becomes significantly larger or becomes significantly more painful in that it restricts your normal daily activities, call back for an appointment and then we will discuss repair. In the meantime, however, I recommend weight loss prior to having the hernia repaired.

## 2013-04-28 ENCOUNTER — Ambulatory Visit: Payer: BC Managed Care – PPO | Admitting: Emergency Medicine

## 2013-04-28 VITALS — BP 142/88 | HR 96 | Temp 97.7°F | Resp 18 | Wt >= 6400 oz

## 2013-04-28 DIAGNOSIS — M2390 Unspecified internal derangement of unspecified knee: Secondary | ICD-10-CM

## 2013-04-28 MED ORDER — NAPROXEN SODIUM 550 MG PO TABS: 550.0000 mg | ORAL_TABLET | Freq: Two times a day (BID) | ORAL | Status: AC

## 2013-04-28 NOTE — Progress Notes (Signed)
[facility] [address], [place] [address] [phone]  Date:  04/28/2013   Name:  Ralph Cervantes   DOB:  [birth year]   MRN:  [id number]  PCP:  Salena Saner., MD    Chief Complaint: Knee Pain   History of Present Illness:  Ralph Cervantes is a 38 y.o. very pleasant male patient who presents with the following:  Patient is morbidly obese and since yesterday has experienced pain in the left knee with getting into and out of his truck at work.  Says it hurts when he plants his foot and rotates.  No effusion or swelling.  No direct trauma.  Says it was painful a month ago at [employer] when he kneeled down on a metal trim strip on his [employer]ianne Lei.  No improvement with over the counter medications or other home remedies. Denies other complaint or health concern today.   Patient Active Problem List   Diagnosis Date Noted  . Umbilical hernia 93/90/3009  . OSA on CPAP 01/03/2013  . Morbid obesity 01/03/2013  . Rosacea 01/03/2013  . GERD (gastroesophageal reflux disease) 01/03/2013    Past Medical History  Diagnosis Date  . GERD (gastroesophageal reflux disease)   . Pericarditis   . Morbid obesity with BMI of 50.0-59.9, adult   . Obstructive sleep apnea     Past Surgical History  Procedure Laterality Date  . Colon aspiration      abscess in colon aspirated   . Pericardial window      History  Substance Use Topics  . Smoking status: Former Research scientist (life sciences)  . Smokeless tobacco: Not on file  . Alcohol Use: Yes    Family History  Problem Relation Age of Onset  . Cancer Father     No Known Allergies  Medication list has been reviewed and updated.  Current Outpatient Prescriptions on File Prior to Visit  Medication Sig Dispense Refill  . doxycycline (DORYX) 100 MG DR capsule Take 100 mg by mouth daily.      Marland Kitchen omeprazole (PRILOSEC) 40 MG capsule Take 40 mg by mouth daily.      Marland Kitchen PHENTERMINE HCL PO Take by mouth.      . testosterone (ANDROGEL) 50 MG/5GM  GEL Place 5 g onto the skin daily.       No current facility-administered medications on file prior to visit.    Review of Systems:  As per HPI, otherwise negative.    Physical Examination: Filed Vitals:   04/28/13 1236  BP: 142/88  Pulse: 96  Temp: 97.7 F (36.5 C)  Resp: 18   Filed Vitals:   04/28/13 1236  Weight: 415 lb (188.243 kg)   Body mass index is 62.18 kg/(m^2). Ideal Body Weight:     GEN: morbidly obese, NAD, Non-toxic, Alert & Oriented x 3 HEENT: Atraumatic, Normocephalic.  Ears and Nose: No external deformity. EXTR: No clubbing/cyanosis/edema NEURO: Normal gait.  PSYCH: Normally interactive. Conversant. Not depressed or anxious appearing.  Calm demeanor.  LEFT KNEE:  No effusion or instability.  No ecchymosis.  Some medial tenderness  Assessment and Plan: Internal derangement knee Follow up in one week if not improved and consider MRI at that time. Anaprox    Signed,  Ellison Carwin, MD

## 2013-04-28 NOTE — Patient Instructions (Signed)
Knee, Cartilage (Meniscus) Injury It is suspected that you have a torn cartilage (meniscus) in your knee. The menisci are made of tough cartilage, and fit between the surfaces of the thigh and leg bones. The menisci are "C"shaped and have a wedged profile. The wedged profile helps the stability of the joint by keeping the rounded femur surface from sliding off the flat tibial surface. The menisci are fed (nourished) by small blood vessels; but there is also a large area at the inner edge of the meniscus that does not have a good blood supply (avascular). This presents a problem when there is an injury to the meniscus, because areas without good blood supply heal poorly. As a result when there is a torn cartilage in the knee, surgery is often required to fix it. This is usually done with a surgical procedure less invasive than open surgery (arthroscopy). Some times open surgery of the knee is required if there is other damage. PURPOSE OF THE MENISCUS The medial meniscus rests on the medial tibial plateau. The tibia is the large bone in your lower leg (the shin bone). The medial tibial plateau is the upper end of the bone making up the inner part of your knee. The lateral meniscus serves the same purpose and is located on the outside of the knee. The menisci help to distribute your body weight across the knee joint; they act as shock absorbers. Without the meniscus present, the weight of your body would be unevenly applied to the bones in your legs (the femur and tibia). The femur is the large bone in your thigh. This uneven weight distribution would cause increased wear and tear on the cartilage lining the joint surfaces, leading to early damage (arthritis) of these areas. The presence of the menisci cartilage is necessary for a healthy knee. PURPOSE OF THE KNEE CARTILAGE The knee joint is made up of three bones: the thigh bone (femur), the shin bone (tibia), and the knee cap (patella). The surfaces of these  bones at the knee joint are covered with cartilage called articular cartilage. This smooth slippery surface allows the bones to slide against each other without causing bone damage. The meniscus sits between these cartilaginous surfaces of the bones. It distributes the weight evenly in the joints and helps with the stability of the joint (keeps the joint steady). HOME CARE INSTRUCTIONS  Use crutches and external braces as instructed.  Once home an ice pack applied to your injured knee may help with discomfort and keep the swelling down. An ice pack can be used for the first couple of days or as instructed.  Only take over-the-counter or prescription medicines for pain, discomfort, or fever as directed by your caregiver.  Call if you do not have relief of pain with medications or if there is increasing in pain.  Call if your foot becomes cold or blue.  You may resume normal diet and activities as directed.  Make sure to keep your appointment with your follow-up caregiver. This injury may require further evaluation and treatment beyond the temporary treatment given today. Document Released: 05/15/2002 Document Revised: 05/17/2011 Document Reviewed: 09/06/2008 [facility] Patient Information 2014 [place], [place].

## 2014-01-01 ENCOUNTER — Ambulatory Visit (INDEPENDENT_AMBULATORY_CARE_PROVIDER_SITE_OTHER): Payer: BC Managed Care – PPO | Admitting: *Deleted

## 2014-01-01 ENCOUNTER — Encounter: Payer: Self-pay | Admitting: Physician Assistant

## 2014-01-01 DIAGNOSIS — Z23 Encounter for immunization: Secondary | ICD-10-CM

## 2014-11-15 ENCOUNTER — Other Ambulatory Visit: Payer: Self-pay | Admitting: Nurse Practitioner

## 2015-01-24 ENCOUNTER — Ambulatory Visit: Payer: Self-pay | Admitting: Skilled Nursing Facility1

## 2015-02-28 ENCOUNTER — Ambulatory Visit: Payer: Self-pay | Admitting: Skilled Nursing Facility1

## 2015-08-14 ENCOUNTER — Ambulatory Visit: Payer: BLUE CROSS/BLUE SHIELD | Admitting: Dietician

## 2016-04-08 ENCOUNTER — Ambulatory Visit (INDEPENDENT_AMBULATORY_CARE_PROVIDER_SITE_OTHER): Payer: Worker's Compensation | Admitting: Orthopedic Surgery

## 2016-04-08 DIAGNOSIS — S93412A Sprain of calcaneofibular ligament of left ankle, initial encounter: Secondary | ICD-10-CM

## 2016-04-08 NOTE — Progress Notes (Signed)
Office Visit Note   Patient: Ralph Cervantes           Date of Birth: [birth year]           MRN: [id number] Visit Date: 04/08/2016              Requested by: No referring provider defined for this encounter. PCP: No primary care provider on file.  Chief Complaint  Patient presents with  . Left Ankle - Injury    DOI 02/27/16 twist injury    HPI: The patient is a 41 year old gentleman who is about 4 weeks out from a lateral ankle sprain. He was has been seen by his primary care several times for the same. Has had negative radiographs of his foot and ankle. Is currently in physical therapy. Physical therapy notes reflect that he is about 70% better than he was at the beginning. continued pain is intermittent. This made worse by inversion or dorsiflexion. Today is pain-free. Is an ASO that he wears for prolonged walking.    Assessment & Plan: Visit Diagnoses:  1. Sprain of calcaneofibular ligament of left ankle, initial encounter     Plan: Recommended that he continue with his current physical therapy. May continue the ASO for prolonged walking. Reassured that things will continue to improve as well as the fact that swelling continues to be a normal finding. He will continue with therapy and activities as tolerated.  Follow-Up Instructions: Return in about 3 weeks (around 04/29/2016).   Left Ankle Exam  Swelling: moderate  Tenderness  The patient is experiencing tenderness in the ATF and CF.   Range of Motion  The patient has normal left ankle ROM.   Tests  Anterior drawer: negative Varus tilt: negative     Physical Exam  Constitutional: Appears well-developed. Morbidly obese. Head: Normocephalic.  Eyes: EOM are normal.  Neck: Normal range of motion.  Cardiovascular: Normal rate.   Pulmonary/Chest: Effort normal.  Neurological: Is alert.  Skin: Skin is warm.  Psychiatric: Has a normal mood and affect.   Imaging: No results found.  Orders:  No orders of the  defined types were placed in this encounter.  No orders of the defined types were placed in this encounter.    Procedures: No procedures performed  Clinical Data: No additional findings.  Subjective: Review of Systems  Constitutional: Negative for chills and fever.  Musculoskeletal: Positive for arthralgias.    Objective: Vital Signs: There were no vitals taken for this visit.  Specialty Comments:  No specialty comments available.  PMFS History: Patient Active Problem List   Diagnosis Date Noted  . Umbilical hernia 29/93/7169  . OSA on CPAP 01/03/2013  . Morbid obesity ([place]) 01/03/2013  . Rosacea 01/03/2013  . GERD (gastroesophageal reflux disease) 01/03/2013   Past Medical History:  Diagnosis Date  . GERD (gastroesophageal reflux disease)   . Morbid obesity with BMI of 50.0-59.9, adult   . Obstructive sleep apnea   . Pericarditis     Family History  Problem Relation Age of Onset  . Cancer Father     Past Surgical History:  Procedure Laterality Date  . colon aspiration     abscess in colon aspirated   . PERICARDIAL WINDOW     Social History   Occupational History  . Not on file.   Social History Main Topics  . Smoking status: Former Research scientist (life sciences)  . Smokeless tobacco: Not on file  . Alcohol use Yes  . Drug use: No  . Sexual  activity: Not on file

## 2016-04-09 ENCOUNTER — Telehealth (INDEPENDENT_AMBULATORY_CARE_PROVIDER_SITE_OTHER): Payer: Self-pay | Admitting: Orthopedic Surgery

## 2016-04-09 NOTE — Telephone Encounter (Signed)
Office note with pt instructions faxed to the number provided att: tracy

## 2016-04-09 NOTE — Telephone Encounter (Signed)
Olivia Mackie (nurse case manager) with [employer] called needing office note for DOS 04/08/16 faxed to her. Also she is requesting work status for the patient. The fax # is [phone]  The phone # is [phone]

## 2016-04-12 ENCOUNTER — Telehealth (INDEPENDENT_AMBULATORY_CARE_PROVIDER_SITE_OTHER): Payer: Self-pay | Admitting: Orthopedic Surgery

## 2016-04-12 NOTE — Telephone Encounter (Signed)
Please fax [facility] pt work status  Olivia Mackie [phone]

## 2016-04-13 ENCOUNTER — Telehealth (INDEPENDENT_AMBULATORY_CARE_PROVIDER_SITE_OTHER): Payer: Self-pay | Admitting: Orthopedic Surgery

## 2016-04-13 NOTE — Telephone Encounter (Signed)
Linus Orn from [employer] called and is requesting the work status paperwork be faxed to [phone].  Thank You.

## 2016-04-15 ENCOUNTER — Telehealth (INDEPENDENT_AMBULATORY_CARE_PROVIDER_SITE_OTHER): Payer: Self-pay | Admitting: Orthopedic Surgery

## 2016-04-15 NOTE — Telephone Encounter (Signed)
Lilyan Punt called wanting to know the work status of the patient. Please fax her at [phone]

## 2016-04-15 NOTE — Telephone Encounter (Signed)
PT NEEDS WORK EXCUSE.... PLEASE CALL HIM AT  [phone]

## 2016-04-15 NOTE — Telephone Encounter (Signed)
Called and spoke with patient advise that we would have him out of work until follow up appt 04/29/16. Note faxed to [facility] per patient request.

## 2016-04-29 ENCOUNTER — Ambulatory Visit (INDEPENDENT_AMBULATORY_CARE_PROVIDER_SITE_OTHER): Payer: Worker's Compensation | Admitting: Orthopedic Surgery

## 2016-04-29 ENCOUNTER — Ambulatory Visit (INDEPENDENT_AMBULATORY_CARE_PROVIDER_SITE_OTHER): Payer: Self-pay | Admitting: Orthopedic Surgery

## 2016-04-29 VITALS — Ht 68.0 in | Wt >= 6400 oz

## 2016-04-29 DIAGNOSIS — S93412A Sprain of calcaneofibular ligament of left ankle, initial encounter: Secondary | ICD-10-CM | POA: Insufficient documentation

## 2016-04-29 DIAGNOSIS — S93412D Sprain of calcaneofibular ligament of left ankle, subsequent encounter: Secondary | ICD-10-CM | POA: Diagnosis not present

## 2016-04-29 NOTE — Progress Notes (Signed)
   Office Visit Note   Patient: Ralph Cervantes           Date of Birth: 07-04-1975           MRN: [id number] Visit Date: 04/29/2016              Requested by: No referring provider defined for this encounter. PCP: No primary care provider on file.  Chief Complaint  Patient presents with  . Left Ankle - Follow-up    02/27/16 twist injury     HPI: Worker's comp twist injury left ankle 02/27/16. He is full weight bearing in regular shoe wear and states that he is ready to return to work. He does question "what signs and symptoms should he be watching for that I could be doing something wrong" he states that "my tendon is sliding a lot less on the side" Autumn L Forrest, RMA  Patient is 2 months out from his injury he is concerned that he may reinjure his ankle going back to work.  Assessment & Plan: Visit Diagnoses: No diagnosis found.  Plan: We will place him in an ASO he is given a note that he may return to work without restrictions. Follow-up as needed. In review with the [place] distal guidelines for permanent partial impairment is permanent partial impairment would be 0% of the left ankle.  Follow-Up Instructions: No Follow-up on file.   Ortho Exam On examination patient is alert oriented no adenopathy well-dressed normal affect normal normal gait. He is a good dorsalis pedis pulse. His negative anterior drawer he has no tenderness to palpation around the ankle. The peroneal tendons posterior tibial tendon and Achilles tendon are nontender to palpation. He has good ankle and subtalar motion.  Imaging: No results found.  Orders:  No orders of the defined types were placed in this encounter.  No orders of the defined types were placed in this encounter.    Procedures: No procedures performed  Clinical Data: No additional findings.  Subjective: Review of Systems  Objective: Vital Signs: There were no vitals taken for this visit.  Specialty Comments:  No  specialty comments available.  PMFS History: Patient Active Problem List   Diagnosis Date Noted  . Umbilical hernia 04/29/7979  . OSA on CPAP 01/03/2013  . Morbid obesity ([place]) 01/03/2013  . Rosacea 01/03/2013  . GERD (gastroesophageal reflux disease) 01/03/2013   Past Medical History:  Diagnosis Date  . GERD (gastroesophageal reflux disease)   . Morbid obesity with BMI of 50.0-59.9, adult   . Obstructive sleep apnea   . Pericarditis     Family History  Problem Relation Age of Onset  . Cancer Father     Past Surgical History:  Procedure Laterality Date  . colon aspiration     abscess in colon aspirated   . PERICARDIAL WINDOW     Social History   Occupational History  . Not on file.   Social History Main Topics  . Smoking status: Former Research scientist (life sciences)  . Smokeless tobacco: Not on file  . Alcohol use Yes  . Drug use: No  . Sexual activity: Not on file

## 2017-11-25 DIAGNOSIS — Z6841 Body Mass Index (BMI) 40.0 and over, adult: Secondary | ICD-10-CM | POA: Diagnosis not present

## 2017-11-25 DIAGNOSIS — E669 Obesity, unspecified: Secondary | ICD-10-CM

## 2018-01-27 ENCOUNTER — Encounter: Payer: Self-pay | Admitting: Nurse Practitioner

## 2018-04-13 ENCOUNTER — Other Ambulatory Visit: Payer: Self-pay | Admitting: Internal Medicine

## 2018-04-13 MED ORDER — OSELTAMIVIR PHOSPHATE 75 MG PO CAPS: 75.0000 mg | ORAL_CAPSULE | Freq: Two times a day (BID) | ORAL | 0 refills | Status: AC

## 2018-04-13 MED ORDER — OSELTAMIVIR PHOSPHATE 75 MG PO CAPS: 75.0000 mg | ORAL_CAPSULE | Freq: Two times a day (BID) | ORAL | 0 refills | Status: DC

## 2018-04-13 NOTE — Progress Notes (Signed)
Pt called stating he has fever/chills and cough. Symptoms started on Tuesday. Temps 102-103. His provider, [name], is out of the office.  Rx Tamiflu 9m twice daily was sent to the pharmacy.

## 2018-05-19 ENCOUNTER — Other Ambulatory Visit: Payer: Self-pay | Admitting: Nurse Practitioner

## 2018-06-26 ENCOUNTER — Other Ambulatory Visit: Payer: Self-pay | Admitting: Nurse Practitioner

## 2018-09-15 ENCOUNTER — Other Ambulatory Visit: Payer: Self-pay | Admitting: Nurse Practitioner

## 2019-01-26 ENCOUNTER — Other Ambulatory Visit: Payer: Self-pay

## 2019-01-26 DIAGNOSIS — Z20822 Contact with and (suspected) exposure to covid-19: Secondary | ICD-10-CM

## 2019-01-29 ENCOUNTER — Telehealth: Payer: Self-pay

## 2019-01-29 ENCOUNTER — Telehealth: Payer: Self-pay | Admitting: *Deleted

## 2019-01-29 LAB — NOVEL CORONAVIRUS, NAA: SARS-CoV-2, NAA: DETECTED — AB

## 2019-01-29 NOTE — Telephone Encounter (Signed)
Patient stated he tested positive for covid and he is unsure of what needs to happen next.  I RETURNED PT CALL AND SCHEDULED HIM FOR A VIRTUAL APPOINTMENT. Lonia Mad

## 2019-01-30 ENCOUNTER — Telehealth (INDEPENDENT_AMBULATORY_CARE_PROVIDER_SITE_OTHER): Payer: BC Managed Care – PPO | Admitting: Nurse Practitioner

## 2019-01-30 ENCOUNTER — Other Ambulatory Visit: Payer: Self-pay

## 2019-01-30 ENCOUNTER — Encounter: Payer: Self-pay | Admitting: Nurse Practitioner

## 2019-01-30 VITALS — Ht 70.0 in | Wt >= 6400 oz

## 2019-01-30 DIAGNOSIS — R0600 Dyspnea, unspecified: Secondary | ICD-10-CM

## 2019-01-30 DIAGNOSIS — R05 Cough: Secondary | ICD-10-CM

## 2019-01-30 DIAGNOSIS — U071 COVID-19: Secondary | ICD-10-CM

## 2019-01-30 DIAGNOSIS — R059 Cough, unspecified: Secondary | ICD-10-CM

## 2019-01-30 DIAGNOSIS — B342 Coronavirus infection, unspecified: Secondary | ICD-10-CM

## 2019-01-30 DIAGNOSIS — R0609 Other forms of dyspnea: Secondary | ICD-10-CM

## 2019-01-30 MED ORDER — ALBUTEROL SULFATE HFA 108 (90 BASE) MCG/ACT IN AERS: 2.0000 | INHALATION_SPRAY | Freq: Four times a day (QID) | RESPIRATORY_TRACT | 0 refills | Status: DC | PRN

## 2019-01-30 MED ORDER — AZITHROMYCIN 250 MG PO TABS: ORAL_TABLET | ORAL | 0 refills | Status: AC

## 2019-01-30 NOTE — Progress Notes (Signed)
Virtual Visit via Video   This visit type was conducted due to national recommendations for restrictions regarding the COVID-19 Pandemic (e.g. social distancing) in an effort to limit this patient's exposure and mitigate transmission in our community.  Due to his co-morbid illnesses, this patient is at least at moderate risk for complications without adequate follow up.  This format is felt to be most appropriate for this patient at this time.  All issues noted in this document were discussed and addressed.  A limited physical exam was performed with this format.    This visit type was conducted due to national recommendations for restrictions regarding the COVID-19 Pandemic (e.g. social distancing) in an effort to limit this patient's exposure and mitigate transmission in our community.  Patients identity confirmed using two different identifiers.  This format is felt to be most appropriate for this patient at this time.  All issues noted in this document were discussed and addressed.  No physical exam was performed (except for noted visual exam findings with Video Visits).    Date:  01/30/2019   ID:  Ralph Cervantes, DOB [birth year], MRN [id number]  Patient Location:  Home - spoke with Ralph Cervantes  Provider location:   Office    Chief Complaint:  Positive coronavirus test   History of Present Illness:    Ralph Cervantes is a 43 y.o. male who presents via video conferencing for a telehealth visit today.    The patient does have symptoms concerning for COVID-19 infection (fever, chills, cough, or new shortness of breath).   He had itchy throat, phlegm.  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  Normal run to [place], [place], [place], [place], [place], [place] and [place].  Was gone Tues and Wed of last week.  Symptoms initially started on Wednesday.  Over the weekend he had a fever up to 101.  When he walks upstairs or demanding activity will cough a little and then be  fine.   He was talking to the pediatricians.  Went on Friday for covid testing.      Past Medical History:  Diagnosis Date  . GERD (gastroesophageal reflux disease)   . Morbid obesity with BMI of 50.0-59.9, adult ([place])   . Obstructive sleep apnea   . Pericarditis    Past Surgical History:  Procedure Laterality Date  . colon aspiration     abscess in colon aspirated   . PERICARDIAL WINDOW       Current Meds  Medication Sig  . omeprazole (PRILOSEC) 40 MG capsule Take 40 mg by mouth daily.     Allergies:   Patient has no known allergies.   Social History   Tobacco Use  . Smoking status: Former Research scientist (life sciences)  . Smokeless tobacco: Never Used  Substance Use Topics  . Alcohol use: Yes  . Drug use: No     Family Hx: The patient's family history includes Cancer in his father.  ROS:   Please see the history of present illness.    Review of Systems  Constitutional: Positive for fever. Negative for chills.  HENT:       Scratchy throat  Respiratory: Positive for cough and shortness of breath (with exertion).   Cardiovascular: Negative.   Musculoskeletal: Negative.   Neurological: Negative for dizziness and tingling.  Psychiatric/Behavioral: Negative.     All other systems reviewed and are negative.   Labs/Other Tests and Data Reviewed:    Recent Labs: No results found for requested labs  within last 8760 hours.   Recent Lipid Panel No results found for: CHOL, TRIG, HDL, CHOLHDL, LDLCALC, LDLDIRECT  Wt Readings from Last 3 Encounters:  01/30/19 (!) 480 lb (217.7 kg)  04/29/16 (!) 415 lb (188.2 kg)  04/28/13 (!) 415 lb (188.2 kg)     Exam:    Vital Signs:  Ht 5' 10"  (1.778 m)   Wt (!) 480 lb (217.7 kg)   BMI 68.87 kg/m     Physical Exam  Constitutional: He is oriented to person, place, and time. He appears distressed.  Pulmonary/Chest: Effort normal. No respiratory distress.  Neurological: He is alert and oriented to person, place, and time. No cranial nerve  deficit.  Psychiatric: Mood, memory, affect and judgment normal.    ASSESSMENT & PLAN:    1. Coronavirus infection  Tested positive on Monday  He is advised to remain quarantined for 14 days, he can go for a retest on November 28 and return to work on Dec 30th - azithromycin (ZITHROMAX) 250 MG tablet; Take 2 tablets (500 mg) on  Day 1,  followed by 1 tablet (250 mg) once daily on Days 2 through 5.  Dispense: 6 each; Refill: 0 - Novel Coronavirus, NAA ([facility]) - albuterol (VENTOLIN HFA) 108 (90 Base) MCG/ACT inhaler; Inhale 2 puffs into the lungs every 6 (six) hours as needed for wheezing or shortness of breath.  Dispense: 18 g; Refill: 0  2. Cough  Intermittent cough  May use albuterol inhaler as needed - azithromycin (ZITHROMAX) 250 MG tablet; Take 2 tablets (500 mg) on  Day 1,  followed by 1 tablet (250 mg) once daily on Days 2 through 5.  Dispense: 6 each; Refill: 0 - Novel Coronavirus, NAA ([facility]) - albuterol (VENTOLIN HFA) 108 (90 Base) MCG/ACT inhaler; Inhale 2 puffs into the lungs every 6 (six) hours as needed for wheezing or shortness of breath.  Dispense: 18 g; Refill: 0  3. Dyspnea on exertion  Use albuterol inhaler as needed if his symptoms worsen he is to go to the ER for evaluation - azithromycin (ZITHROMAX) 250 MG tablet; Take 2 tablets (500 mg) on  Day 1,  followed by 1 tablet (250 mg) once daily on Days 2 through 5.  Dispense: 6 each; Refill: 0 - Novel Coronavirus, NAA ([facility]) - albuterol (VENTOLIN HFA) 108 (90 Base) MCG/ACT inhaler; Inhale 2 puffs into the lungs every 6 (six) hours as needed for wheezing or shortness of breath.  Dispense: 18 g; Refill: 0   COVID-19 Education: The signs and symptoms of COVID-19 were discussed with the patient and how to seek care for testing (follow up with PCP or arrange E-visit).  The importance of social distancing was discussed today.  Patient Risk:   After full review of this patients clinical status, I feel that they are  at least moderate risk at this time.  Time:   Today, I have spent 12 minutes/ seconds with the patient with telehealth technology discussing above diagnoses.     Medication Adjustments/Labs and Tests Ordered: Current medicines are reviewed at length with the patient today.  Concerns regarding medicines are outlined above.   Tests Ordered: Orders Placed This Encounter  Procedures  . Novel Coronavirus, NAA ([facility])    Medication Changes: Meds ordered this encounter  Medications  . azithromycin (ZITHROMAX) 250 MG tablet    Sig: Take 2 tablets (500 mg) on  Day 1,  followed by 1 tablet (250 mg) once daily on Days 2 through 5.  Dispense:  6 each    Refill:  0  . albuterol (VENTOLIN HFA) 108 (90 Base) MCG/ACT inhaler    Sig: Inhale 2 puffs into the lungs every 6 (six) hours as needed for wheezing or shortness of breath.    Dispense:  18 g    Refill:  0    Disposition:  Follow up prn  Signed, [name], FNP

## 2019-02-04 ENCOUNTER — Encounter: Payer: Self-pay | Admitting: Nurse Practitioner

## 2019-02-05 ENCOUNTER — Other Ambulatory Visit: Payer: Self-pay

## 2019-02-05 DIAGNOSIS — Z20822 Contact with and (suspected) exposure to covid-19: Secondary | ICD-10-CM

## 2019-02-06 LAB — NOVEL CORONAVIRUS, NAA: SARS-CoV-2, NAA: DETECTED — AB

## 2019-02-07 ENCOUNTER — Encounter: Payer: Self-pay | Admitting: Nurse Practitioner

## 2019-03-30 ENCOUNTER — Encounter: Payer: Self-pay | Admitting: Nurse Practitioner

## 2019-04-09 ENCOUNTER — Ambulatory Visit: Payer: BC Managed Care – PPO | Admitting: Nurse Practitioner

## 2019-04-10 ENCOUNTER — Ambulatory Visit: Payer: BC Managed Care – PPO | Admitting: Nurse Practitioner

## 2019-04-25 ENCOUNTER — Telehealth: Payer: Self-pay

## 2019-04-25 NOTE — Telephone Encounter (Signed)
ATT TO CONTACT PT TO ADVISE OFC CLOSED 2/18 DUE TO WEATHER. NO ANS LVM ADVISING PT APPT RESCHEDULED 2/24@830 

## 2019-04-26 ENCOUNTER — Ambulatory Visit: Payer: BC Managed Care – PPO | Admitting: Nurse Practitioner

## 2019-05-02 ENCOUNTER — Ambulatory Visit (INDEPENDENT_AMBULATORY_CARE_PROVIDER_SITE_OTHER): Payer: BC Managed Care – PPO | Admitting: Nurse Practitioner

## 2019-05-02 ENCOUNTER — Other Ambulatory Visit: Payer: Self-pay

## 2019-05-02 ENCOUNTER — Encounter: Payer: Self-pay | Admitting: Nurse Practitioner

## 2019-05-02 VITALS — BP 114/82 | HR 88 | Temp 98.4°F | Ht 70.0 in | Wt >= 6400 oz

## 2019-05-02 DIAGNOSIS — L719 Rosacea, unspecified: Secondary | ICD-10-CM

## 2019-05-02 DIAGNOSIS — Z6841 Body Mass Index (BMI) 40.0 and over, adult: Secondary | ICD-10-CM

## 2019-05-02 DIAGNOSIS — R21 Rash and other nonspecific skin eruption: Secondary | ICD-10-CM

## 2019-05-02 DIAGNOSIS — G4730 Sleep apnea, unspecified: Secondary | ICD-10-CM | POA: Diagnosis not present

## 2019-05-02 DIAGNOSIS — Z760 Encounter for issue of repeat prescription: Secondary | ICD-10-CM

## 2019-05-02 MED ORDER — TRIAMCINOLONE ACETONIDE 0.1 % EX CREA: 1.0000 "application " | TOPICAL_CREAM | Freq: Two times a day (BID) | CUTANEOUS | 0 refills | Status: DC

## 2019-05-02 MED ORDER — OMEPRAZOLE 40 MG PO CPDR: 40.0000 mg | DELAYED_RELEASE_CAPSULE | Freq: Every day | ORAL | 1 refills | Status: DC

## 2019-05-02 NOTE — Progress Notes (Signed)
This visit occurred during the SARS-CoV-2 public health emergency.  Safety protocols were in place, including screening questions prior to the visit, additional usage of staff PPE, and extensive cleaning of exam room while observing appropriate contact time as indicated for disinfecting solutions.  Subjective:     Patient ID: Ralph Cervantes , male    DOB: 1975-05-07 , 44 y.o.   MRN: [id number]   Chief Complaint  Patient presents with  . Cpap  . Dry skin on stomach  . Medication Refill    doxycyline    HPI  He is here due to his CPAP machine causes issues, he has had his CPAP machine for over 5 years.  He is wearing his CPAP machine nightly and benefits with the use.  He would also like to have a local company manage his CPAP machine.  He is now buying mask from a company in [place].    Wt Readings from Last 3 Encounters: 05/02/19 : (!) 495 lb 12.8 oz (224.9 kg) 01/30/19 : (!) 480 lb (217.7 kg) 04/29/16 : (!) 415 lb (188.2 kg)  He is currently out on workers comp for right forearm injury an MRI is planned for later today.     Medication Refill This is a chronic (doxycycline) problem. The current episode started more than 1 year ago. The problem occurs rarely. Associated symptoms include a rash (dry patch to skin of lower abdomen). Pertinent negatives include no chest pain, coughing, fatigue or headaches.  Rash This is a new problem. The current episode started more than 1 month ago (1-2 months). The problem has been gradually worsening since onset. Location: lower abdomen. The rash is characterized by dryness and itchiness. Pertinent negatives include no cough or fatigue. Past treatments include nothing.     Past Medical History:  Diagnosis Date  . GERD (gastroesophageal reflux disease)   . Morbid obesity with BMI of 50.0-59.9, adult ([place])   . Obstructive sleep apnea   . Pericarditis      Family History  Problem Relation Age of Onset  . Cancer Father      Current  Outpatient Medications:  .  albuterol (VENTOLIN HFA) 108 (90 Base) MCG/ACT inhaler, Inhale 2 puffs into the lungs every 6 (six) hours as needed for wheezing or shortness of breath., Disp: 18 g, Rfl: 0 .  doxycycline (DORYX) 100 MG DR capsule, Take 100 mg by mouth daily., Disp: , Rfl:  .  omeprazole (PRILOSEC) 40 MG capsule, Take 1 capsule (40 mg total) by mouth daily., Disp: 90 capsule, Rfl: 1   No Known Allergies   Review of Systems  Constitutional: Negative.  Negative for fatigue.  Respiratory: Negative.  Negative for cough.   Cardiovascular: Negative.  Negative for chest pain, palpitations and leg swelling.  Skin: Positive for rash (dry patch to skin of lower abdomen).  Neurological: Negative for dizziness and headaches.  Psychiatric/Behavioral: Negative.      Today's Vitals   05/02/19 0838  BP: 114/82  Temp: 98.4 F (36.9 C)  TempSrc: Oral  Weight: (!) 495 lb 12.8 oz (224.9 kg)  Height: 5' 10"  (1.778 m)   Body mass index is 71.14 kg/m.   Objective:  Physical Exam Constitutional:      Appearance: Normal appearance.  Cardiovascular:     Rate and Rhythm: Normal rate and regular rhythm.     Pulses: Normal pulses.     Heart sounds: Normal heart sounds. No murmur.  Pulmonary:     Effort: Pulmonary effort is  normal. No respiratory distress.     Breath sounds: Normal breath sounds.  Skin:    Capillary Refill: Capillary refill takes less than 2 seconds.  Neurological:     General: No focal deficit present.     Mental Status: He is alert and oriented to person, place, and time.  Psychiatric:        Mood and Affect: Mood normal.        Behavior: Behavior normal.        Thought Content: Thought content normal.        Judgment: Judgment normal.         Assessment And Plan:     1. Sleep apnea, unspecified type  Needs new supplies and machine and would like to get locally, will send order to [place]  He benefits from using his cpap nightly and provides him a better  quality of life when wearing - Hemoglobin A1c  2. Rash and nonspecific skin eruption  Dry ,scaly rash to lower part of abdomen  Will treat with triamcinolone for dermatitis - triamcinolone cream (KENALOG) 0.1 %; Apply 1 application topically 2 (two) times daily.  Dispense: 30 g; Refill: 0  3. Rosacea  Refill for doxycline sent  This worsens in the warmer months  4. Morbid obesity ([place])  Chronic, he has gained approximately 15 lbs since his last visit  No longer taking saxenda  Encouraged to increase physical activity  He will likely need to be seen by a weight loss center  5. BMI 70 and over, adult ([place])  - CMP14+EGFR - Lipid panel        [name], FNP    THE PATIENT IS ENCOURAGED TO PRACTICE SOCIAL DISTANCING DUE TO THE COVID-19 PANDEMIC.

## 2019-05-03 LAB — CMP14+EGFR
ALT: 34 IU/L (ref 0–44)
AST: 20 IU/L (ref 0–40)
Albumin/Globulin Ratio: 1.4 (ref 1.2–2.2)
Albumin: 4 g/dL (ref 4.0–5.0)
Alkaline Phosphatase: 82 IU/L (ref 39–117)
BUN/Creatinine Ratio: 15 (ref 9–20)
BUN: 16 mg/dL (ref 6–24)
Bilirubin Total: 0.4 mg/dL (ref 0.0–1.2)
CO2: 23 mmol/L (ref 20–29)
Calcium: 9.1 mg/dL (ref 8.7–10.2)
Chloride: 106 mmol/L (ref 96–106)
Creatinine, Ser: 1.04 mg/dL (ref 0.76–1.27)
GFR calc Af Amer: 100 mL/min/{1.73_m2} (ref >59)
GFR calc non Af Amer: 86 mL/min/{1.73_m2} (ref >59)
Globulin, Total: 2.9 g/dL (ref 1.5–4.5)
Glucose: 107 mg/dL — ABNORMAL HIGH (ref 65–99)
Potassium: 4.3 mmol/L (ref 3.5–5.2)
Sodium: 144 mmol/L (ref 134–144)
Total Protein: 6.9 g/dL (ref 6.0–8.5)

## 2019-05-03 LAB — LIPID PANEL
Chol/HDL Ratio: 5.2 ratio — ABNORMAL HIGH (ref 0.0–5.0)
Cholesterol, Total: 157 mg/dL (ref 100–199)
HDL: 30 mg/dL — ABNORMAL LOW (ref >39)
LDL Chol Calc (NIH): 102 mg/dL — ABNORMAL HIGH (ref 0–99)
Triglycerides: 139 mg/dL (ref 0–149)
VLDL Cholesterol Cal: 25 mg/dL (ref 5–40)

## 2019-05-03 LAB — HEMOGLOBIN A1C
Est. average glucose Bld gHb Est-mCnc: 120 mg/dL
Hgb A1c MFr Bld: 5.8 % — ABNORMAL HIGH (ref 4.8–5.6)

## 2019-05-11 ENCOUNTER — Encounter: Payer: Self-pay | Admitting: Nurse Practitioner

## 2019-05-14 ENCOUNTER — Other Ambulatory Visit: Payer: Self-pay

## 2019-05-14 ENCOUNTER — Encounter: Payer: Self-pay | Admitting: Nurse Practitioner

## 2019-05-14 ENCOUNTER — Ambulatory Visit: Payer: BC Managed Care – PPO | Admitting: Nurse Practitioner

## 2019-05-14 VITALS — BP 132/86 | HR 101 | Temp 98.0°F | Ht 67.4 in | Wt >= 6400 oz

## 2019-05-14 DIAGNOSIS — H6123 Impacted cerumen, bilateral: Secondary | ICD-10-CM

## 2019-05-14 DIAGNOSIS — H6122 Impacted cerumen, left ear: Secondary | ICD-10-CM | POA: Diagnosis not present

## 2019-05-14 DIAGNOSIS — H6121 Impacted cerumen, right ear: Secondary | ICD-10-CM | POA: Diagnosis not present

## 2019-05-14 DIAGNOSIS — H9192 Unspecified hearing loss, left ear: Secondary | ICD-10-CM

## 2019-05-14 NOTE — Progress Notes (Signed)
This visit occurred during the SARS-CoV-2 public health emergency.  Safety protocols were in place, including screening questions prior to the visit, additional usage of staff PPE, and extensive cleaning of exam room while observing appropriate contact time as indicated for disinfecting solutions.  Subjective:     Patient ID: Ralph Cervantes , male    DOB: 03/12/1975 , 44 y.o.   MRN: [id number]   Chief Complaint  Patient presents with  . difficulty hearing    patient stated he is unable to hear he thinks it may be wax     HPI  He scheduled for an MRI last week - placed ear plugs in.  Couple days later began having difficulty with hearing to left ear.  Has used water and peroxide.  Has not used any debrox drops.     Past Medical History:  Diagnosis Date  . GERD (gastroesophageal reflux disease)   . Morbid obesity with BMI of 50.0-59.9, adult ([place])   . Obstructive sleep apnea   . Pericarditis      Family History  Problem Relation Age of Onset  . Cancer Father      Current Outpatient Medications:  .  albuterol (VENTOLIN HFA) 108 (90 Base) MCG/ACT inhaler, Inhale 2 puffs into the lungs every 6 (six) hours as needed for wheezing or shortness of breath., Disp: 18 g, Rfl: 0 .  doxycycline (DORYX) 100 MG DR capsule, Take 100 mg by mouth daily., Disp: , Rfl:  .  omeprazole (PRILOSEC) 40 MG capsule, Take 1 capsule (40 mg total) by mouth daily., Disp: 90 capsule, Rfl: 1 .  triamcinolone cream (KENALOG) 0.1 %, Apply 1 application topically 2 (two) times daily., Disp: 30 g, Rfl: 0   No Known Allergies   Review of Systems  Constitutional: Negative.   HENT: Positive for hearing loss (left ear). Negative for ear pain.   Respiratory: Negative.   Cardiovascular: Negative.  Negative for chest pain, palpitations and leg swelling.  Neurological: Negative for dizziness and headaches.  Psychiatric/Behavioral: Negative.      Today's Vitals   05/14/19 1205  BP: 132/86  Pulse: (!) 101   Temp: 98 F (36.7 C)  TempSrc: Oral  Weight: (!) 499 lb 3.2 oz (226.4 kg)  Height: 5' 7.4" (1.712 m)  PainSc: 0-No pain   Body mass index is 77.26 kg/m.   Objective:  Physical Exam Constitutional:      Appearance: Normal appearance.  HENT:     Right Ear: External ear normal. There is impacted cerumen (soft cerumen present to canal).     Left Ear: External ear normal. There is impacted cerumen (soft wax occluded the canal).  Cardiovascular:     Rate and Rhythm: Normal rate and regular rhythm.     Pulses: Normal pulses.     Heart sounds: Normal heart sounds. No murmur.  Pulmonary:     Effort: Pulmonary effort is normal. No respiratory distress.     Breath sounds: Normal breath sounds.  Skin:    Capillary Refill: Capillary refill takes less than 2 seconds.  Neurological:     General: No focal deficit present.     Mental Status: He is alert and oriented to person, place, and time.     Cranial Nerves: No cranial nerve deficit.  Psychiatric:        Mood and Affect: Mood normal.        Behavior: Behavior normal.        Thought Content: Thought content normal.  Judgment: Judgment normal.         Assessment And Plan:   1. Bilateral impacted cerumen  Right ear with cerumen removed with lighted curette  Left ear irrigated with water lavage, appears to have cerumen deep in canal  Advised to use 1/2 water and 1/2 peroxide to ear daily for the next 3-4 days if hearing not better return call to office may need a referral to ENT  2. Hearing loss of left ear, unspecified hearing loss type  Improved after cleaning ears but will continue to monitor   [name], FNP    THE PATIENT IS ENCOURAGED TO PRACTICE SOCIAL DISTANCING DUE TO THE COVID-19 PANDEMIC.

## 2019-05-24 ENCOUNTER — Ambulatory Visit: Payer: Self-pay | Attending: Internal Medicine

## 2019-05-24 DIAGNOSIS — Z23 Encounter for immunization: Secondary | ICD-10-CM

## 2019-05-24 NOTE — Progress Notes (Signed)
   Covid-19 Vaccination Clinic  Name:  Ralph Cervantes    MRN: [id number] DOB: May 14, 1975  05/24/2019  [name] was observed post Covid-19 immunization for 15 minutes without incident. He was provided with Vaccine Information Sheet and instruction to access the V-Safe system.   [name] was instructed to call 911 with any severe reactions post vaccine: Marland Kitchen Difficulty breathing  . Swelling of face and throat  . A fast heartbeat  . A bad rash all over body  . Dizziness and weakness   Immunizations Administered    Name Date Dose VIS Date Route   Pfizer COVID-19 Vaccine 05/24/2019  2:17 PM 0.3 mL 02/16/2019 Intramuscular   Manufacturer: [place]   Lot: [id number]   [place]: [id number]

## 2019-05-31 ENCOUNTER — Encounter: Payer: Self-pay | Admitting: Nurse Practitioner

## 2019-06-07 ENCOUNTER — Other Ambulatory Visit: Payer: Self-pay

## 2019-06-18 ENCOUNTER — Ambulatory Visit: Payer: Self-pay | Attending: Internal Medicine

## 2019-06-18 ENCOUNTER — Telehealth: Payer: Self-pay

## 2019-06-18 DIAGNOSIS — Z23 Encounter for immunization: Secondary | ICD-10-CM

## 2019-06-18 NOTE — Telephone Encounter (Signed)
I called patient to notify him we have received a fax stating [facility] will be sending out his supplies for his home sleep study test. Ralph Cervantes

## 2019-06-18 NOTE — Progress Notes (Signed)
   Covid-19 Vaccination Clinic  Name:  Ralph Cervantes    MRN: [id number] DOB: April 11, 1975  06/18/2019  Mr. Ralph Cervantes was observed post Covid-19 immunization for 15 minutes without incident. He was provided with Vaccine Information Sheet and instruction to access the V-Safe system.   Mr. Ralph Cervantes was instructed to call 911 with any severe reactions post vaccine: Marland Kitchen Difficulty breathing  . Swelling of face and throat  . A fast heartbeat  . A bad rash all over body  . Dizziness and weakness   Immunizations Administered    Name Date Dose VIS Date Route   Pfizer COVID-19 Vaccine 06/18/2019  3:00 PM 0.3 mL 02/16/2019 Intramuscular   Manufacturer: [place]   Lot: [id number]   [place]: [id number]

## 2019-06-20 ENCOUNTER — Encounter: Payer: Self-pay | Admitting: Nurse Practitioner

## 2019-06-21 ENCOUNTER — Encounter: Payer: Self-pay | Admitting: Nurse Practitioner

## 2019-07-02 ENCOUNTER — Encounter: Payer: Self-pay | Admitting: Nurse Practitioner

## 2019-07-03 ENCOUNTER — Other Ambulatory Visit: Payer: Self-pay

## 2019-07-03 ENCOUNTER — Encounter: Payer: Self-pay | Admitting: Nurse Practitioner

## 2019-07-12 ENCOUNTER — Encounter: Payer: Self-pay | Admitting: Nurse Practitioner

## 2019-07-18 ENCOUNTER — Encounter: Payer: Self-pay | Admitting: Nurse Practitioner

## 2019-07-31 ENCOUNTER — Other Ambulatory Visit: Payer: Self-pay | Admitting: Nurse Practitioner

## 2019-07-31 ENCOUNTER — Telehealth: Payer: Self-pay

## 2019-07-31 DIAGNOSIS — G4730 Sleep apnea, unspecified: Secondary | ICD-10-CM

## 2019-07-31 NOTE — Telephone Encounter (Signed)
error 

## 2019-08-03 ENCOUNTER — Encounter: Payer: Self-pay | Admitting: Nurse Practitioner

## 2019-08-20 ENCOUNTER — Encounter: Payer: Self-pay | Admitting: Nurse Practitioner

## 2019-10-17 ENCOUNTER — Encounter: Payer: Self-pay | Admitting: Nurse Practitioner

## 2019-10-22 ENCOUNTER — Ambulatory Visit: Payer: Self-pay | Admitting: Nurse Practitioner

## 2019-10-22 ENCOUNTER — Telehealth: Payer: Self-pay | Admitting: Nurse Practitioner

## 2019-10-22 DIAGNOSIS — G4730 Sleep apnea, unspecified: Secondary | ICD-10-CM

## 2019-10-22 NOTE — Telephone Encounter (Signed)
Called [facility] to discuss will refer to sleep provider to help with his CPAP settings and supplies, sleep study done with [facility].

## 2019-11-05 ENCOUNTER — Encounter: Payer: Self-pay | Admitting: Nurse Practitioner

## 2019-11-11 ENCOUNTER — Encounter: Payer: Self-pay | Admitting: Nurse Practitioner

## 2019-11-13 ENCOUNTER — Other Ambulatory Visit: Payer: Self-pay | Admitting: Nurse Practitioner

## 2019-11-13 DIAGNOSIS — L719 Rosacea, unspecified: Secondary | ICD-10-CM

## 2019-11-13 MED ORDER — DOXYCYCLINE MONOHYDRATE 100 MG PO CAPS: 100.0000 mg | ORAL_CAPSULE | Freq: Every day | ORAL | 0 refills | Status: DC

## 2019-12-14 ENCOUNTER — Telehealth: Payer: Self-pay

## 2019-12-14 NOTE — Telephone Encounter (Signed)
Erica with [facility] returned your call regarding pt. [phone] ext [place]

## 2020-01-22 ENCOUNTER — Encounter: Payer: Self-pay | Admitting: Nurse Practitioner

## 2020-01-22 ENCOUNTER — Other Ambulatory Visit: Payer: Self-pay

## 2020-01-22 ENCOUNTER — Ambulatory Visit: Payer: BC Managed Care – PPO | Admitting: Nurse Practitioner

## 2020-01-22 VITALS — BP 112/80 | HR 83 | Temp 97.5°F | Ht 67.4 in | Wt >= 6400 oz

## 2020-01-22 DIAGNOSIS — H6123 Impacted cerumen, bilateral: Secondary | ICD-10-CM

## 2020-01-22 DIAGNOSIS — G4733 Obstructive sleep apnea (adult) (pediatric): Secondary | ICD-10-CM | POA: Diagnosis not present

## 2020-01-22 DIAGNOSIS — Z1159 Encounter for screening for other viral diseases: Secondary | ICD-10-CM

## 2020-01-22 DIAGNOSIS — Z6841 Body Mass Index (BMI) 40.0 and over, adult: Secondary | ICD-10-CM | POA: Diagnosis not present

## 2020-01-22 DIAGNOSIS — R7309 Other abnormal glucose: Secondary | ICD-10-CM

## 2020-01-22 DIAGNOSIS — L719 Rosacea, unspecified: Secondary | ICD-10-CM

## 2020-01-22 DIAGNOSIS — Z23 Encounter for immunization: Secondary | ICD-10-CM

## 2020-01-22 DIAGNOSIS — E785 Hyperlipidemia, unspecified: Secondary | ICD-10-CM

## 2020-01-22 DIAGNOSIS — H9192 Unspecified hearing loss, left ear: Secondary | ICD-10-CM

## 2020-01-22 DIAGNOSIS — K219 Gastro-esophageal reflux disease without esophagitis: Secondary | ICD-10-CM

## 2020-01-22 DIAGNOSIS — Z9989 Dependence on other enabling machines and devices: Secondary | ICD-10-CM

## 2020-01-22 MED ORDER — OMEPRAZOLE 40 MG PO CPDR: 40.0000 mg | DELAYED_RELEASE_CAPSULE | Freq: Every day | ORAL | 1 refills | Status: DC

## 2020-01-22 MED ORDER — DOXYCYCLINE MONOHYDRATE 100 MG PO CAPS: 100.0000 mg | ORAL_CAPSULE | Freq: Every day | ORAL | 0 refills | Status: DC

## 2020-01-22 NOTE — Progress Notes (Signed)
I,Yamilka Roman [employer] as a Education administrator for [employer], FNP.,have documented all relevant documentation on the behalf of [name], FNP,as directed by  [name], FNP while in the presence of [name], [place]. This visit occurred during the SARS-CoV-2 public health emergency.  Safety protocols were in place, including screening questions prior to the visit, additional usage of staff PPE, and extensive cleaning of exam room while observing appropriate contact time as indicated for disinfecting solutions.  Subjective:     Patient ID: Ralph Cervantes , male    DOB: Aug 25, 1975 , 44 y.o.   MRN: [id number]   Chief Complaint  Patient presents with  . cpap f/u    patient stated his ins company requires him to come in and be seen to make sure machine is working okay.  . Gastroesophageal Reflux  . rosacea f/u    HPI  Patient stated he is here because his ins company requires that he be seen 30 days after receiving his cpap machine to make sure it is working correctly.  He is using his CPAP nightly he denies any issues with the machine.   He is reluctant to see another provider for his sleep/CPAP management.    Wt Readings from Last 3 Encounters: 01/22/20 : (!) 497 lb 12.8 oz (225.8 kg) 05/14/19 : (!) 499 lb 3.2 oz (226.4 kg) 05/02/19 : (!) 495 lb 12.8 oz (224.9 kg)  He is having left ear pressure after waking up at night.    Gastroesophageal Reflux He reports no abdominal pain or no chest pain. The problem occurs constantly. Nothing aggravates the symptoms. Risk factors include obesity. Past procedures do not include an abdominal ultrasound.     Past Medical History:  Diagnosis Date  . GERD (gastroesophageal reflux disease)   . Morbid obesity with BMI of 50.0-59.9, adult ([place])   . Obstructive sleep apnea   . Pericarditis      Family History  Problem Relation Age of Onset  . Cancer Father      Current Outpatient Medications:  .  doxycycline (MONODOX) 100 MG capsule, Take  1 capsule (100 mg total) by mouth daily. May take 1 capsule by mouth 2 times a day for exacerbations, Disp: 90 capsule, Rfl: 0 .  omeprazole (PRILOSEC) 40 MG capsule, Take 1 capsule (40 mg total) by mouth daily., Disp: 90 capsule, Rfl: 1   No Known Allergies   Review of Systems  Constitutional: Negative.   Respiratory: Negative.   Cardiovascular: Negative.  Negative for chest pain and leg swelling.  Gastrointestinal: Negative for abdominal pain.  Skin:       He has several skin tags to his left back area that have been bothering him.  Psychiatric/Behavioral: Negative.      Today's Vitals   01/22/20 1100  BP: 112/80  Pulse: 83  Temp: (!) 97.5 F (36.4 C)  SpO2: 97%  Weight: (!) 497 lb 12.8 oz (225.8 kg)  Height: 5' 7.4" (1.712 m)  PainSc: 0-No pain   Body mass index is 77.04 kg/m.   Objective:  Physical Exam Vitals reviewed.  Constitutional:      General: He is not in acute distress.    Appearance: Normal appearance. He is obese.  Cardiovascular:     Rate and Rhythm: Normal rate and regular rhythm.     Pulses: Normal pulses.     Heart sounds: Normal heart sounds. No murmur heard.   Pulmonary:     Effort: Pulmonary effort is normal. No respiratory distress.  Breath sounds: Normal breath sounds. No wheezing.  Skin:    General: Skin is warm.     Findings: Erythema (with patches of dry skin as well to his face primarily his cheeks and forehead) present.  Neurological:     General: No focal deficit present.     Mental Status: He is alert and oriented to person, place, and time.     Cranial Nerves: No cranial nerve deficit.  Psychiatric:        Mood and Affect: Mood normal.        Behavior: Behavior normal.        Thought Content: Thought content normal.        Judgment: Judgment normal.         Assessment And Plan:     1. Rosacea  He does have erythema and dry patches to his face on cheeks and forehead  Will renew his doxycycline - doxycycline (MONODOX)  100 MG capsule; Take 1 capsule (100 mg total) by mouth daily. May take 1 capsule by mouth 2 times a day for exacerbations  Dispense: 90 capsule; Refill: 0  2. OSA on CPAP  He now has a new CPAP but does need to have routine management in the event he needs changes to be made.   I will refer to pulmonology  - Ambulatory referral to Pulmonology  3. BMI 70 and over, adult [facility])  I have discussed with him again about going to a bariatric clinic and he continues to decline   I have stressed the importance of weight loss and his risk for cardiac events.  4. Morbid obesity ([place])  5. Hearing loss of left ear, unspecified hearing loss type  He has cerumen impaction to both ears with the left being worse.  6. Elevated lipids  Chronic, no current medications  Diet controlled  Will check lipid panel - Lipid panel  7. Abnormal glucose  Chronic, stable  No current medications  Encouraged to limit intake of sugary foods and drinks  Encouraged to increase physical activity to 150 minutes per week - Hemoglobin A1c  8. Bilateral impacted cerumen  Water lavage done with good results - Ear Lavage  9. Gastroesophageal reflux disease without esophagitis  Chronic, stable  Continue current medications  10. Encounter for hepatitis C screening test for low risk patient  Will check Hepatitis C screening due to recent recommendations to screen all adults 18 years and older - Hepatitis C antibody  11. Encounter for immunization  Influenza vaccine administered  Encouraged to take Tylenol as needed for fever or muscle aches. - Flu Vaccine QUAD 6+ mos PF IM (Fluarix Quad PF)     Patient was given opportunity to ask questions. Patient verbalized understanding of the plan and was able to repeat key elements of the plan. All questions were answered to their satisfaction.    Teola Bradley, FNP, have reviewed all documentation for this visit. The documentation on 02/03/20 for the  exam, diagnosis, procedures, and orders are all accurate and complete.  THE PATIENT IS ENCOURAGED TO PRACTICE SOCIAL DISTANCING DUE TO THE COVID-19 PANDEMIC.

## 2020-01-23 LAB — LIPID PANEL
Chol/HDL Ratio: 5.8 ratio — ABNORMAL HIGH (ref 0.0–5.0)
Cholesterol, Total: 150 mg/dL (ref 100–199)
HDL: 26 mg/dL — ABNORMAL LOW (ref >39)
LDL Chol Calc (NIH): 89 mg/dL (ref 0–99)
Triglycerides: 205 mg/dL — ABNORMAL HIGH (ref 0–149)
VLDL Cholesterol Cal: 35 mg/dL (ref 5–40)

## 2020-01-23 LAB — HEMOGLOBIN A1C
Est. average glucose Bld gHb Est-mCnc: 123 mg/dL
Hgb A1c MFr Bld: 5.9 % — ABNORMAL HIGH (ref 4.8–5.6)

## 2020-01-23 LAB — HEPATITIS C ANTIBODY: Hep C Virus Ab: <0.1 s/co ratio (ref 0.0–0.9)

## 2020-02-04 ENCOUNTER — Other Ambulatory Visit: Payer: Self-pay | Admitting: Nurse Practitioner

## 2020-02-04 DIAGNOSIS — R7309 Other abnormal glucose: Secondary | ICD-10-CM

## 2020-02-04 MED ORDER — METFORMIN HCL 500 MG PO TABS: 500.0000 mg | ORAL_TABLET | Freq: Every day | ORAL | 2 refills | Status: DC

## 2020-02-04 NOTE — Progress Notes (Signed)
I have sent a prescription to take once a day in am, take with food and may cause GI upset. Schedule a 4-6 week med check follow up

## 2020-02-19 ENCOUNTER — Telehealth: Payer: Self-pay

## 2020-02-19 NOTE — Telephone Encounter (Signed)
Error

## 2020-02-19 NOTE — Telephone Encounter (Signed)
Informed patient about prescription. Willing to start, has appointment set for 03/06/20

## 2020-02-20 ENCOUNTER — Other Ambulatory Visit: Payer: Self-pay

## 2020-02-20 MED ORDER — ATORVASTATIN CALCIUM 10 MG PO TABS: 10.0000 mg | ORAL_TABLET | Freq: Every day | ORAL | 2 refills | Status: DC

## 2020-02-26 ENCOUNTER — Encounter: Payer: Self-pay | Admitting: Nurse Practitioner

## 2020-03-06 ENCOUNTER — Ambulatory Visit: Payer: BC Managed Care – PPO | Admitting: Nurse Practitioner

## 2020-03-06 ENCOUNTER — Other Ambulatory Visit: Payer: Self-pay

## 2020-03-06 ENCOUNTER — Encounter: Payer: Self-pay | Admitting: Nurse Practitioner

## 2020-03-06 VITALS — BP 116/80 | HR 86 | Temp 97.7°F | Ht 67.4 in | Wt >= 6400 oz

## 2020-03-06 DIAGNOSIS — R7309 Other abnormal glucose: Secondary | ICD-10-CM | POA: Diagnosis not present

## 2020-03-06 MED ORDER — METFORMIN HCL ER 500 MG PO TB24: 500.0000 mg | ORAL_TABLET | Freq: Every day | ORAL | 2 refills | Status: DC

## 2020-03-06 NOTE — Progress Notes (Signed)
I,Yamilka [employer] as a Education administrator for [employer], FNP.,have documented all relevant documentation on the behalf of [name], FNP,as directed by  [name], FNP while in the presence of [name], [place]. This visit occurred during the SARS-CoV-2 public health emergency.  Safety protocols were in place, including screening questions prior to the visit, additional usage of staff PPE, and extensive cleaning of exam room while observing appropriate contact time as indicated for disinfecting solutions.  Subjective:     Patient ID: Ralph Cervantes , male    DOB: 02/10/1976 , 44 y.o.   MRN: [id number]   Chief Complaint  Patient presents with  . MED CHECK    Patient recently started the metformin. He stated he is having diarrhea after taking it     HPI  Patient here for a f/u on his abnormal glucose. He started metformin and he been tolerating it ok he has been having diarrhea.  When he eats certain meals will cause him loose stools.    Wt Readings from Last 3 Encounters: 03/06/20 : (!) 489 lb 9.6 oz (222.1 kg) 01/22/20 : (!) 497 lb 12.8 oz (225.8 kg) 05/14/19 : (!) 499 lb 3.2 oz (226.4 kg)    Past Medical History:  Diagnosis Date  . GERD (gastroesophageal reflux disease)   . Morbid obesity with BMI of 50.0-59.9, adult ([place])   . Obstructive sleep apnea   . Pericarditis      Family History  Problem Relation Age of Onset  . Cancer Father      Current Outpatient Medications:  .  atorvastatin (LIPITOR) 10 MG tablet, Take 1 tablet (10 mg total) by mouth daily., Disp: 30 tablet, Rfl: 2 .  doxycycline (MONODOX) 100 MG capsule, Take 1 capsule (100 mg total) by mouth daily. May take 1 capsule by mouth 2 times a day for exacerbations, Disp: 90 capsule, Rfl: 0 .  metFORMIN (GLUCOPHAGE XR) 500 MG 24 hr tablet, Take 1 tablet (500 mg total) by mouth daily with breakfast., Disp: 30 tablet, Rfl: 2 .  omeprazole (PRILOSEC) 40 MG capsule, Take 1 capsule (40 mg total) by mouth daily.,  Disp: 90 capsule, Rfl: 1   No Known Allergies   Review of Systems  Constitutional: Negative.   Respiratory: Negative.   Cardiovascular: Negative.   Psychiatric/Behavioral: Negative.      Today's Vitals   03/06/20 1044  BP: 116/80  Pulse: 86  Temp: 97.7 F (36.5 C)  TempSrc: Oral  Weight: (!) 489 lb 9.6 oz (222.1 kg)  Height: 5' 7.4" (1.712 m)  PainSc: 0-No pain   Body mass index is 75.77 kg/m.   Objective:  Physical Exam Vitals reviewed.  Constitutional:      General: He is not in acute distress.    Appearance: Normal appearance.  Cardiovascular:     Rate and Rhythm: Normal rate and regular rhythm.  Pulmonary:     Effort: Pulmonary effort is normal. No respiratory distress.  Skin:    Capillary Refill: Capillary refill takes less than 2 seconds.  Neurological:     General: No focal deficit present.     Mental Status: He is alert and oriented to person, place, and time.     Cranial Nerves: No cranial nerve deficit.  Psychiatric:        Mood and Affect: Mood normal.        Behavior: Behavior normal.        Thought Content: Thought content normal.  Judgment: Judgment normal.         Assessment And Plan:     1. 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  Dispense: 30 tablet; Refill: 2     Patient was given opportunity to ask questions. Patient verbalized understanding of the plan and was able to repeat key elements of the plan. All questions were answered to their satisfaction.  [name], FNP   I, [name], FNP, have reviewed all  documentation for this visit. The documentation on 03/06/20 for the exam, diagnosis, procedures, and orders are all accurate and complete.  THE PATIENT IS ENCOURAGED TO PRACTICE SOCIAL DISTANCING DUE TO THE COVID-19 PANDEMIC.

## 2020-03-06 NOTE — Patient Instructions (Signed)
Fat and Cholesterol Restricted Eating Plan Getting too much fat and cholesterol in your diet may cause health problems. Choosing the right foods helps keep your fat and cholesterol at normal levels. This can keep you from getting certain diseases. Your doctor may recommend an eating plan that includes:  Total fat: ______% or less of total calories a day.  Saturated fat: ______% or less of total calories a day.  Cholesterol: less than _________mg a day.  Fiber: ______g a day. What are tips for following this plan? Meal planning  At meals, divide your plate into four equal parts: ? Fill one-half of your plate with vegetables and green salads. ? Fill one-fourth of your plate with whole grains. ? Fill one-fourth of your plate with low-fat (lean) protein foods.  Eat fish that is high in omega-3 fats at least two times a week. This includes mackerel, tuna, sardines, and salmon.  Eat foods that are high in fiber, such as whole grains, beans, apples, broccoli, carrots, peas, and barley. General tips   Work with your doctor to lose weight if you need to.  Avoid: ? Foods with added sugar. ? Fried foods. ? Foods with partially hydrogenated oils.  Limit alcohol intake to no more than 1 drink a day for nonpregnant women and 2 drinks a day for men. One drink equals 12 oz of beer, 5 oz of wine, or 1 oz of hard liquor. Reading food labels  Check food labels for: ? Trans fats. ? Partially hydrogenated oils. ? Saturated fat (g) in each serving. ? Cholesterol (mg) in each serving. ? Fiber (g) in each serving.  Choose foods with healthy fats, such as: ? Monounsaturated fats. ? Polyunsaturated fats. ? Omega-3 fats.  Choose grain products that have whole grains. Look for the word "whole" as the first word in the ingredient list. Cooking  Cook foods using low-fat methods. These include baking, boiling, grilling, and broiling.  Eat more home-cooked foods. Eat at restaurants and buffets  less often.  Avoid cooking using saturated fats, such as butter, cream, palm oil, palm kernel oil, and coconut oil. Recommended foods  Fruits  All fresh, canned (in natural juice), or frozen fruits. Vegetables  Fresh or frozen vegetables (raw, steamed, roasted, or grilled). Green salads. Grains  Whole grains, such as whole wheat or whole grain breads, crackers, cereals, and pasta. Unsweetened oatmeal, bulgur, barley, quinoa, or brown rice. Corn or whole wheat flour tortillas. Meats and other protein foods  Ground beef (85% or leaner), grass-fed beef, or beef trimmed of fat. Skinless chicken or [place]. Ground chicken or [place]. Pork trimmed of fat. All fish and seafood. Egg whites. Dried beans, peas, or lentils. Unsalted nuts or seeds. Unsalted canned beans. Nut butters without added sugar or oil. Dairy  Low-fat or nonfat dairy products, such as skim or 1% milk, 2% or reduced-fat cheeses, low-fat and fat-free ricotta or cottage cheese, or plain low-fat and nonfat yogurt. Fats and oils  Tub margarine without trans fats. Light or reduced-fat mayonnaise and salad dressings. Avocado. Olive, canola, sesame, or safflower oils. The items listed above may not be a complete list of foods and beverages you can eat. Contact a dietitian for more information. Foods to avoid Fruits  Canned fruit in heavy syrup. Fruit in cream or butter sauce. Fried fruit. Vegetables  Vegetables cooked in cheese, cream, or butter sauce. Fried vegetables. Grains  White bread. White pasta. White rice. Cornbread. Bagels, pastries, and croissants. Crackers and snack foods that contain trans fat  and hydrogenated oils. Meats and other protein foods  Fatty cuts of meat. Ribs, chicken wings, bacon, sausage, bologna, salami, chitterlings, fatback, hot dogs, bratwurst, and packaged lunch meats. Liver and organ meats. Whole eggs and egg yolks. Chicken and [place] with skin. Fried meat. Dairy  Whole or 2% milk, cream,  half-and-half, and cream cheese. Whole milk cheeses. Whole-fat or sweetened yogurt. Full-fat cheeses. Nondairy creamers and whipped toppings. Processed cheese, cheese spreads, and cheese curds. Beverages  Alcohol. Sugar-sweetened drinks such as sodas, lemonade, and fruit drinks. Fats and oils  Butter, stick margarine, lard, shortening, ghee, or bacon fat. Coconut, palm kernel, and palm oils. Sweets and desserts  Corn syrup, sugars, honey, and molasses. Candy. Jam and jelly. Syrup. Sweetened cereals. Cookies, pies, cakes, donuts, muffins, and ice cream. The items listed above may not be a complete list of foods and beverages you should avoid. Contact a dietitian for more information. Summary  Choosing the right foods helps keep your fat and cholesterol at normal levels. This can keep you from getting certain diseases.  At meals, fill one-half of your plate with vegetables and green salads.  Eat high-fiber foods, like whole grains, beans, apples, carrots, peas, and barley.  Limit added sugar, saturated fats, alcohol, and fried foods. This information is not intended to replace advice given to you by your health care provider. Make sure you discuss any questions you have with your health care provider. Document Revised: 10/26/2017 Document Reviewed: 11/09/2016 Elsevier Patient Education  [place].

## 2020-03-13 ENCOUNTER — Encounter: Payer: BC Managed Care – PPO | Admitting: Nurse Practitioner

## 2020-04-04 ENCOUNTER — Encounter: Payer: Self-pay | Admitting: Pulmonary Disease

## 2020-04-04 ENCOUNTER — Ambulatory Visit (INDEPENDENT_AMBULATORY_CARE_PROVIDER_SITE_OTHER): Payer: BC Managed Care – PPO | Admitting: Pulmonary Disease

## 2020-04-04 ENCOUNTER — Other Ambulatory Visit: Payer: Self-pay

## 2020-04-04 VITALS — BP 132/82 | HR 87 | Temp 97.5°F | Ht 70.0 in | Wt >= 6400 oz

## 2020-04-04 DIAGNOSIS — G4733 Obstructive sleep apnea (adult) (pediatric): Secondary | ICD-10-CM

## 2020-04-04 DIAGNOSIS — Z9989 Dependence on other enabling machines and devices: Secondary | ICD-10-CM | POA: Diagnosis not present

## 2020-04-04 NOTE — Patient Instructions (Signed)
History of obstructive sleep apnea with good control with CPAP therapy  Continue current CPAP therapy  We will contact [facility] to get a copy of your result to have on record  Strongly encourage continuing weight loss efforts  I will see you back in 6 months

## 2020-04-04 NOTE — Progress Notes (Addendum)
Ralph Cervantes    [id number]    1975/04/19  Primary Care Physician:Moore, Doreene Burke, [place]  Referring Physician: [name], [address] [place] [place],  [place] [address]  Chief complaint:   Patient with known obstructive sleep apnea  HPI:  Diagnosed with obstructive sleep apnea about 12 years ago Had a repeat study recently showing persistence of his obstructive sleep apnea and did receive a new machine  DME company is [employer]  Usually goes to bed about 11:50 Takes about 5 to 10 minutes to fall asleep 2-3 awakenings Final wake up time about 6 AM  Had significant weight gain following quitting smoking about 12 to 13 years ago  Sleep is restorative Wakes up feeling like is not a good nights rest Does not usually go to sleep without using his CPAP No obstructive sleep apnea in his family His memory is average Able to concentrate  He is a Pharmacist, community  He gained a significant amount of weight following quitting smoking His weight trend is down recently  Outpatient Encounter Medications as of 04/04/2020  Medication Sig  . atorvastatin (LIPITOR) 10 MG tablet Take 1 tablet (10 mg total) by mouth daily.  Marland Kitchen doxycycline (MONODOX) 100 MG capsule Take 1 capsule (100 mg total) by mouth daily. May take 1 capsule by mouth 2 times a day for exacerbations  . metFORMIN (GLUCOPHAGE XR) 500 MG 24 hr tablet Take 1 tablet (500 mg total) by mouth daily with breakfast.  . omeprazole (PRILOSEC) 40 MG capsule Take 1 capsule (40 mg total) by mouth daily.   No facility-administered encounter medications on file as of 04/04/2020.    Allergies as of 04/04/2020  . (No Known Allergies)    Past Medical History:  Diagnosis Date  . GERD (gastroesophageal reflux disease)   . Morbid obesity with BMI of 50.0-59.9, adult ([place])   . Obstructive sleep apnea   . Pericarditis     Past Surgical History:  Procedure Laterality Date  . colon aspiration     abscess in colon aspirated    . PERICARDIAL WINDOW      Family History  Problem Relation Age of Onset  . Cancer Father     Social History   Socioeconomic History  . Marital status: Married    Spouse name: Not on file  . Number of children: Not on file  . Years of education: Not on file  . Highest education level: Not on file  Occupational History  . Not on file  Tobacco Use  . Smoking status: Former Smoker    Types: Cigarettes    Quit date: 2007    Years since quitting: 15.0  . Smokeless tobacco: Never Used  Vaping Use  . Vaping Use: Never used  Substance and Sexual Activity  . Alcohol use: Yes  . Drug use: No  . Sexual activity: Not on file  Other Topics Concern  . Not on file  Social History Narrative  . Not on file   Social Determinants of Health   Financial Resource Strain: Not on file  Food Insecurity: Not on file  Transportation Needs: Not on file  Physical Activity: Not on file  Stress: Not on file  Social Connections: Not on file  Intimate Partner Violence: Not on file    Review of Systems  Constitutional: Positive for fatigue.  Respiratory: Positive for apnea. Negative for shortness of breath.   Cardiovascular: Negative for chest pain.  Psychiatric/Behavioral: Negative for sleep  disturbance.    Vitals:   04/04/20 0917  BP: 132/82  Pulse: 87  Temp: (!) 97.5 F (36.4 C)  SpO2: 94%     Physical Exam Constitutional:      Appearance: He is obese.  HENT:     Head: Normocephalic.     Nose: No congestion.     Mouth/Throat:     Mouth: Mucous membranes are moist.     Comments: Mallampati 4, crowded oropharynx Eyes:     Pupils: Pupils are equal, round, and reactive to light.  Cardiovascular:     Rate and Rhythm: Normal rate and regular rhythm.     Heart sounds: No murmur heard.   Pulmonary:     Effort: No respiratory distress.     Breath sounds: No stridor. No wheezing or rhonchi.  Musculoskeletal:     Cervical back: No rigidity or tenderness.  Neurological:      Mental Status: He is alert.  Psychiatric:        Mood and Affect: Mood normal.    Compliance data reviewed showing excellent compliance of 100% CPAP set at 14 Residual AHI of 0.4  Had a study performed with CPAP in place showing AHI of about 2- April 2021  Assessment:  Obstructive sleep apnea -Compliant with CPAP use  Super obesity -Weight loss efforts encouraged -More aggressive interventions probably need to be discussed regarding weight control  Activity level is good Sleep is restorative Attest continues to benefit from CPAP use  Plan/Recommendations: Continue CPAP therapy Continue CPAP at 14  We will contact [facility] to get a copy of his positive study  Encourage aggressive weight loss measures   Tentative follow-up in 6 months Encouraged to call with any significant concerns   Sherrilyn Rist MD [facility] 04/04/2020, 9:34 AM  CC: [name], FNP  Reviewed the study from [employer] showing severe obstructive sleep apnea copy will be scanned to chart was performed 11/11/2019 -AHI of 70.9

## 2020-05-05 ENCOUNTER — Ambulatory Visit: Payer: BC Managed Care – PPO | Admitting: Nurse Practitioner

## 2020-05-07 ENCOUNTER — Other Ambulatory Visit: Payer: Self-pay

## 2020-05-07 ENCOUNTER — Encounter: Payer: Self-pay | Admitting: Nurse Practitioner

## 2020-05-07 ENCOUNTER — Ambulatory Visit: Payer: BC Managed Care – PPO | Admitting: Nurse Practitioner

## 2020-05-07 VITALS — BP 128/68 | HR 86 | Temp 97.7°F | Ht 67.0 in | Wt >= 6400 oz

## 2020-05-07 DIAGNOSIS — E785 Hyperlipidemia, unspecified: Secondary | ICD-10-CM

## 2020-05-07 DIAGNOSIS — L719 Rosacea, unspecified: Secondary | ICD-10-CM

## 2020-05-07 DIAGNOSIS — Z0000 Encounter for general adult medical examination without abnormal findings: Secondary | ICD-10-CM

## 2020-05-07 DIAGNOSIS — Z125 Encounter for screening for malignant neoplasm of prostate: Secondary | ICD-10-CM

## 2020-05-07 DIAGNOSIS — H6123 Impacted cerumen, bilateral: Secondary | ICD-10-CM | POA: Diagnosis not present

## 2020-05-07 DIAGNOSIS — R7309 Other abnormal glucose: Secondary | ICD-10-CM | POA: Diagnosis not present

## 2020-05-07 DIAGNOSIS — Z1211 Encounter for screening for malignant neoplasm of colon: Secondary | ICD-10-CM

## 2020-05-07 DIAGNOSIS — Z6841 Body Mass Index (BMI) 40.0 and over, adult: Secondary | ICD-10-CM

## 2020-05-07 DIAGNOSIS — Z114 Encounter for screening for human immunodeficiency virus [HIV]: Secondary | ICD-10-CM

## 2020-05-07 MED ORDER — METFORMIN HCL ER 500 MG PO TB24: 500.0000 mg | ORAL_TABLET | Freq: Every day | ORAL | 2 refills | Status: DC

## 2020-05-07 NOTE — Patient Instructions (Addendum)

## 2020-05-07 NOTE — Progress Notes (Signed)
I,Tianna Badgett,acting as a Education administrator for [employer], NP.,have documented all relevant documentation on the behalf of [employer], NP,as directed by  Bary Castilla, NP while in the presence of Bary Castilla, NP.  This visit occurred during the SARS-CoV-2 public health emergency.  Safety protocols were in place, including screening questions prior to the visit, additional usage of staff PPE, and extensive cleaning of exam room while observing appropriate contact time as indicated for disinfecting solutions.  Subjective:     Patient ID: Ralph Cervantes , male    DOB: 06/02/1975 , 45 y.o.   MRN: [id number]   Chief Complaint  Patient presents with  . Annual Exam    HPI  Patient is here for physical exam. He states that he is complaint with medications. He has no concerns at this time.   Put in a order for a colonoscopy.    Dentist 6 months cleaning.  Drink: moderately  Smoke: No  Diet: he eats fast food a lot. Atleast 4-5 days week.  Exercise: Works at the [employer] so he does a lot of walking.     Past Medical History:  Diagnosis Date  . GERD (gastroesophageal reflux disease)   . Morbid obesity with BMI of 50.0-59.9, adult ([place])   . Obstructive sleep apnea   . Pericarditis      Family History  Problem Relation Age of Onset  . Cancer Father      Current Outpatient Medications:  .  doxycycline (MONODOX) 100 MG capsule, Take 1 capsule (100 mg total) by mouth daily. May take 1 capsule by mouth 2 times a day for exacerbations, Disp: 90 capsule, Rfl: 0 .  metFORMIN (GLUCOPHAGE XR) 500 MG 24 hr tablet, Take 1 tablet (500 mg total) by mouth daily with breakfast., Disp: 30 tablet, Rfl: 2 .  omeprazole (PRILOSEC) 40 MG capsule, Take 1 capsule (40 mg total) by mouth daily., Disp: 90 capsule, Rfl: 1 .  atorvastatin (LIPITOR) 10 MG tablet, Take 1 tablet (10 mg total) by mouth daily., Disp: 30 tablet, Rfl: 2   No Known Allergies   Men's preventive visit. Patient  Health Questionnaire (PHQ-2) is  [place] [facility] Visit from 03/06/2020 in [facility]  PHQ-2 Total Score 0    . Patient is not on a not on a diet. Marital status: Married. Relevant history for alcohol use is:  Social History   Substance and Sexual Activity  Alcohol Use Yes  . Relevant history for tobacco use is:  Social History   Tobacco Use  Smoking Status Former Smoker  . Types: Cigarettes  . Quit date: 2007  . Years since quitting: 15.1  Smokeless Tobacco Never Used  .   Review of Systems  Constitutional: Negative.  Negative for chills and fever.  HENT: Negative.  Negative for congestion, ear pain and sinus pressure.   Eyes: Negative.  Negative for pain.  Respiratory: Negative.  Negative for chest tightness, shortness of breath and wheezing.   Cardiovascular: Negative.  Negative for chest pain.  Gastrointestinal: Negative.  Negative for constipation, diarrhea, nausea and vomiting.  Endocrine: Negative.  Negative for polydipsia, polyphagia and polyuria.  Genitourinary: Negative.   Musculoskeletal: Negative for arthralgias and myalgias.  Skin: Negative.   Allergic/Immunologic: Negative.   Neurological: Negative.  Negative for numbness and headaches.  Hematological: Negative.   Psychiatric/Behavioral: Negative.  Negative for sleep disturbance.     Today's Vitals   05/07/20 1006  BP: 128/68  Pulse: 86  Temp: 97.7 F (  36.5 C)  TempSrc: Oral  Weight: (!) 487 lb 3.2 oz (221 kg)  Height: _0  (1.702 m)   Body mass index is 76.31 kg/m.  Wt Readings from Last 3 Encounters:  05/07/20 (!) 487 lb 3.2 oz (221 kg)  04/04/20 (!) 493 lb (223.6 kg)  03/06/20 (!) 489 lb 9.6 oz (222.1 kg)    Objective:  Physical Exam Vitals reviewed.  Constitutional:      Appearance: Normal appearance. He is obese.  HENT:     Head: Normocephalic and atraumatic.     Right Ear: There is impacted cerumen.     Left Ear: There is impacted cerumen.     Nose:      Comments: Deferred. Masked     Mouth/Throat:     Comments: Deferred. Masked  Eyes:     General:        Right eye: No discharge.        Left eye: No discharge.     Extraocular Movements: Extraocular movements intact.     Conjunctiva/sclera: Conjunctivae normal.     Pupils: Pupils are equal, round, and reactive to light.  Cardiovascular:     Rate and Rhythm: Normal rate and regular rhythm.     Pulses: Normal pulses.     Heart sounds: Normal heart sounds. No murmur heard.   Pulmonary:     Effort: Pulmonary effort is normal. No respiratory distress.     Breath sounds: Normal breath sounds. No wheezing.  Abdominal:     General: Bowel sounds are normal.     Tenderness: There is no abdominal tenderness. There is no guarding.  Genitourinary:    Comments: Deferred. Patient would like to get a PSA blood test.  Musculoskeletal:        General: No swelling or tenderness. Normal range of motion.     Cervical back: Normal range of motion and neck supple.  Skin:    General: Skin is warm and dry.     Capillary Refill: Capillary refill takes less than 2 seconds.     Findings: Rash present.     Comments: Rosacea on face. Patient has skin tags around his neck.   Neurological:     Mental Status: He is alert and oriented to person, place, and time.     Motor: No weakness.  Psychiatric:        Mood and Affect: Mood normal.        Behavior: Behavior normal.        Thought Content: Thought content normal.        Judgment: Judgment normal.        Assessment And Plan:    1. Encounter for annual physical exam -Went over the importance of annual physical exams, screenings, immunization and multivitamins. -No EKG due to now hypertension   2. Abnormal glucose -Stable, currently taking Metformin XR 500 mg daily with breakfast  -Will check Hgb A1c today  - CBC - Hemoglobin A1c - CMP14+EGFR  3. Rosacea -patient currently taking doxycycline 100 mg BID  -Ambulatory referral to dermatology    4. Elevated lipids - Stable, chronic  - Lipid panel -Lipitor 10 mg once daily   5. Bilateral impacted cerumen -Used curette in both ears.   6. Morbid obesity ([place]) - Hemoglobin A1c -Educated patient about staying on a healthy diet to include fruits and vegetables with greens.  -Exercise 150 min. For atleast 4-5 days a week.    7. Screening PSA (prostate specific antigen) - PSA  8. BMI 70 and over, adult [facility]) -Educated patient about eating a healthy diet which includes greens, vegetables and fruits. Increase the intake of water. Avoid sugary foods and drinks. Avoid red meats and fatty foods. Increase fish intake.    9. Encounter for screening for HIV - HIV Antibody (routine testing w rflx)  10. Screening for colon cancer - Ambulatory referral to Gastroenterology for colonoscopy    Staying healthy and adopting a healthy lifestyle for your overall health is important. You should eat 7 or more servings of fruits and vegetables per day. You should drink plenty of water to keep yourself hydrated and your kidneys healthy. This includes about 65-80+ fluid ounces of water. Limit your intake of animal fats especially for elevated cholesterol. Avoid highly processed food and limit your salt intake if you have hypertension. Avoid foods high in saturated/Trans fats. Along with a healthy diet it is also very important to maintain time for yourself to maintain a healthy mental health with low stress levels. You should get atleast 150 min of moderate intensity exercise weekly for a healthy heart. Along with eating right and exercising, aim for at least 7-9 hours of sleep daily.  Eat more whole grains which includes barley, wheat berries, oats, brown rice and whole wheat pasta. Use healthy plant oils which include olive, soy, corn, sunflower and peanut. Limit your caffeine and sugary drinks. Limit your intake of fast foods. Limit milk and dairy products to one or two daily servings.    Patient was  given opportunity to ask questions. Patient verbalized understanding of the plan and was able to repeat key elements of the plan. All questions were answered to their satisfaction.    Bary Castilla, NP   I, Bary Castilla, NP, have reviewed all documentation for this visit. The documentation on 05/07/20 for the exam, diagnosis, procedures, and orders are all accurate and complete.  THE PATIENT IS ENCOURAGED TO PRACTICE SOCIAL DISTANCING DUE TO THE COVID-19 PANDEMIC.

## 2020-05-08 LAB — CMP14+EGFR
ALT: 28 IU/L (ref 0–44)
AST: 24 IU/L (ref 0–40)
Albumin/Globulin Ratio: 1.5 (ref 1.2–2.2)
Albumin: 4.4 g/dL (ref 4.0–5.0)
Alkaline Phosphatase: 79 IU/L (ref 44–121)
BUN/Creatinine Ratio: 16 (ref 9–20)
BUN: 16 mg/dL (ref 6–24)
Bilirubin Total: 0.4 mg/dL (ref 0.0–1.2)
CO2: 20 mmol/L (ref 20–29)
Calcium: 9.3 mg/dL (ref 8.7–10.2)
Chloride: 103 mmol/L (ref 96–106)
Creatinine, Ser: 1 mg/dL (ref 0.76–1.27)
Globulin, Total: 2.9 g/dL (ref 1.5–4.5)
Glucose: 106 mg/dL — ABNORMAL HIGH (ref 65–99)
Potassium: 4.4 mmol/L (ref 3.5–5.2)
Sodium: 140 mmol/L (ref 134–144)
Total Protein: 7.3 g/dL (ref 6.0–8.5)
eGFR: 94 mL/min/{1.73_m2} (ref >59)

## 2020-05-08 LAB — CBC
Hematocrit: 45.8 % (ref 37.5–51.0)
Hemoglobin: 15.2 g/dL (ref 13.0–17.7)
MCH: 29 pg (ref 26.6–33.0)
MCHC: 33.2 g/dL (ref 31.5–35.7)
MCV: 87 fL (ref 79–97)
Platelets: 235 10*3/uL (ref 150–450)
RBC: 5.24 x10E6/uL (ref 4.14–5.80)
RDW: 12.8 % (ref 11.6–15.4)
WBC: 7.8 10*3/uL (ref 3.4–10.8)

## 2020-05-08 LAB — HIV ANTIBODY (ROUTINE TESTING W REFLEX): HIV Screen 4th Generation wRfx: NONREACTIVE

## 2020-05-08 LAB — HEMOGLOBIN A1C
Est. average glucose Bld gHb Est-mCnc: 123 mg/dL
Hgb A1c MFr Bld: 5.9 % — ABNORMAL HIGH (ref 4.8–5.6)

## 2020-05-08 LAB — PSA: Prostate Specific Ag, Serum: 0.3 ng/mL (ref 0.0–4.0)

## 2020-05-08 LAB — LIPID PANEL
Chol/HDL Ratio: 5.5 ratio — ABNORMAL HIGH (ref 0.0–5.0)
Cholesterol, Total: 171 mg/dL (ref 100–199)
HDL: 31 mg/dL — ABNORMAL LOW (ref >39)
LDL Chol Calc (NIH): 114 mg/dL — ABNORMAL HIGH (ref 0–99)
Triglycerides: 147 mg/dL (ref 0–149)
VLDL Cholesterol Cal: 26 mg/dL (ref 5–40)

## 2020-06-30 NOTE — Progress Notes (Signed)
Please disregard ,opened in error

## 2020-10-20 ENCOUNTER — Encounter: Payer: Self-pay | Admitting: Gastroenterology

## 2020-11-12 ENCOUNTER — Encounter: Payer: Self-pay | Admitting: Nurse Practitioner

## 2020-11-12 ENCOUNTER — Other Ambulatory Visit: Payer: Self-pay

## 2020-11-12 ENCOUNTER — Ambulatory Visit: Payer: BC Managed Care – PPO | Admitting: Nurse Practitioner

## 2020-11-12 VITALS — BP 126/84 | HR 97 | Temp 97.8°F | Ht 67.0 in | Wt >= 6400 oz

## 2020-11-12 DIAGNOSIS — G4733 Obstructive sleep apnea (adult) (pediatric): Secondary | ICD-10-CM | POA: Diagnosis not present

## 2020-11-12 DIAGNOSIS — Z6841 Body Mass Index (BMI) 40.0 and over, adult: Secondary | ICD-10-CM

## 2020-11-12 DIAGNOSIS — R197 Diarrhea, unspecified: Secondary | ICD-10-CM | POA: Diagnosis not present

## 2020-11-12 DIAGNOSIS — L719 Rosacea, unspecified: Secondary | ICD-10-CM

## 2020-11-12 DIAGNOSIS — E785 Hyperlipidemia, unspecified: Secondary | ICD-10-CM

## 2020-11-12 DIAGNOSIS — R7309 Other abnormal glucose: Secondary | ICD-10-CM | POA: Diagnosis not present

## 2020-11-12 DIAGNOSIS — Z23 Encounter for immunization: Secondary | ICD-10-CM

## 2020-11-12 DIAGNOSIS — Z9989 Dependence on other enabling machines and devices: Secondary | ICD-10-CM

## 2020-11-12 LAB — LIPID PANEL
Chol/HDL Ratio: 5.4 ratio — ABNORMAL HIGH (ref 0.0–5.0)
Cholesterol, Total: 167 mg/dL (ref 100–199)
HDL: 31 mg/dL — ABNORMAL LOW (ref >39)
LDL Chol Calc (NIH): 108 mg/dL — ABNORMAL HIGH (ref 0–99)
Triglycerides: 155 mg/dL — ABNORMAL HIGH (ref 0–149)
VLDL Cholesterol Cal: 28 mg/dL (ref 5–40)

## 2020-11-12 MED ORDER — PROBIOTIC (LACTOBACILLUS) PO CAPS: 1.0000 | ORAL_CAPSULE | Freq: Every day | ORAL | 2 refills | Status: DC

## 2020-11-12 MED ORDER — DOXYCYCLINE MONOHYDRATE 100 MG PO CAPS: 100.0000 mg | ORAL_CAPSULE | Freq: Every day | ORAL | 1 refills | Status: DC

## 2020-11-12 NOTE — Patient Instructions (Signed)

## 2020-11-12 NOTE — Progress Notes (Signed)
I,Yamilka Roman [employer] as a Education administrator for [employer], FNP.,have documented all relevant documentation on the behalf of [name], FNP,as directed by  [name], FNP while in the presence of [name], [place].   This visit occurred during the SARS-CoV-2 public health emergency.  Safety protocols were in place, including screening questions prior to the visit, additional usage of staff PPE, and extensive cleaning of exam room while observing appropriate contact time as indicated for disinfecting solutions.  Subjective:     Patient ID: Ralph Cervantes , male    DOB: 03/25/1975 , 45 y.o.   MRN: [id number]   Chief Complaint  Patient presents with   Hyperlipidemia   abnormal glucose   Diarrhea    Patient stated he has been having some issues with his stomach. Everything he eats is running straight through him causing really watery stools.     HPI  Patient here for a f/u on his abnormal glucose abd cholesterol. He is not taking atorvastatin or metformin XR. Reports having diarrhea stools with each meal.  Drinking very little soda. He is having nocturia 2-3 times a night relating to drinking increased amounts of water. Reports drinking approximately 3-4 (64 oz) bottles of water. Unsure of what he ate for breakfast - chinese food, pretzels and sub.  Steak, zucchini, 2 hot dogs and hamburger and ice cream.  Mostly drinks water.   Wt Readings from Last 3 Encounters: 11/12/20 : (!) 505 lb (229.1 kg) 05/07/20 : (!) 487 lb 3.2 oz (221 kg) 04/04/20 : (!) 493 lb (223.6 kg)    Hyperlipidemia This is a chronic problem. The current episode started more than 1 month ago. The problem is uncontrolled. Recent lipid tests were reviewed and are variable. Exacerbating diseases include obesity. He has no history of chronic renal disease. prediabetes. There are no known factors aggravating his hyperlipidemia. Pertinent negatives include no chest pain or shortness of breath. Compliance problems include  adherence to exercise, medication side effects and adherence to diet.  Risk factors for coronary artery disease include male sex, obesity, dyslipidemia and a sedentary lifestyle.    Past Medical History:  Diagnosis Date   GERD (gastroesophageal reflux disease)    Morbid obesity with BMI of 50.0-59.9, adult ([facility])    Obstructive sleep apnea    Pericarditis      Family History  Problem Relation Age of Onset   Cancer Father      Current Outpatient Medications:    omeprazole (PRILOSEC) 40 MG capsule, Take 1 capsule (40 mg total) by mouth daily., Disp: 90 capsule, Rfl: 1   Probiotic, Lactobacillus, CAPS, Take 1 tablet by mouth daily., Disp: 30 capsule, Rfl: 2   atorvastatin (LIPITOR) 10 MG tablet, Take 1 tablet (10 mg total) by mouth daily. (Patient not taking: Reported on 11/12/2020), Disp: 30 tablet, Rfl: 2   doxycycline (MONODOX) 100 MG capsule, Take 1 capsule (100 mg total) by mouth daily. May take 1 capsule by mouth 2 times a day for exacerbations, Disp: 90 capsule, Rfl: 1   metFORMIN (GLUCOPHAGE XR) 500 MG 24 hr tablet, Take 1 tablet (500 mg total) by mouth daily with breakfast. (Patient not taking: Reported on 11/12/2020), Disp: 30 tablet, Rfl: 2   No Known Allergies   Review of Systems  Constitutional: Negative.  Negative for fatigue.  Respiratory: Negative.  Negative for shortness of breath and wheezing.   Cardiovascular: Negative.  Negative for chest pain, palpitations and leg swelling.  Gastrointestinal:  Positive for diarrhea. Negative for  abdominal pain, nausea and vomiting.  Neurological:  Negative for dizziness and headaches.  Psychiatric/Behavioral: Negative.      Today's Vitals   11/12/20 0830  BP: 126/84  Pulse: 97  Temp: 97.8 F (36.6 C)  TempSrc: Oral  Weight: (!) 505 lb (229.1 kg)  Height: 5' 7"  (1.702 m)   Body mass index is 79.09 kg/m.   Objective:  Physical Exam Vitals reviewed.  Constitutional:      General: He is not in acute distress.    Appearance:  Normal appearance. He is obese.  Cardiovascular:     Rate and Rhythm: Normal rate and regular rhythm.     Pulses: Normal pulses.     Heart sounds: Normal heart sounds. No murmur heard. Pulmonary:     Effort: Pulmonary effort is normal. No respiratory distress.     Breath sounds: Normal breath sounds. No wheezing.  Skin:    Capillary Refill: Capillary refill takes less than 2 seconds.  Neurological:     General: No focal deficit present.     Mental Status: He is alert and oriented to person, place, and time.     Cranial Nerves: No cranial nerve deficit.     Motor: No weakness.  Psychiatric:        Mood and Affect: Mood normal.        Behavior: Behavior normal.        Thought Content: Thought content normal.        Judgment: Judgment normal.        Assessment And Plan:     1. Elevated lipids Comments: He has not been taking his cholesterol medication, will restart statin - Lipid panel  2. Abnormal glucose Comments: Has not been taking his metformin, will recheck his HgbA1c Discussed eating a diet low in carbs and sugars - Hemoglobin A1c  3. Diarrhea, unspecified type Comments: May be related to IBS encouraged to take an over the counter probiotic - Probiotic, Lactobacillus, CAPS; Take 1 tablet by mouth daily.  Dispense: 30 capsule; Refill: 2 - Culture, Stool - Clostridium difficile Toxin A/B - Ova and parasite examination  4. OSA on CPAP Comments: continue follow up with Pulmonary for CPAP management  5. Rosacea Comments: no changes - doxycycline (MONODOX) 100 MG capsule; Take 1 capsule (100 mg total) by mouth daily. May take 1 capsule by mouth 2 times a day for exacerbations  Dispense: 90 capsule; Refill: 1  6. BMI 70 and over, adult [facility]) I am extremely concerned about his weight, he has gained approximately 22 lbs since March. I will refer him to [facility] center.  - Amb Ref to Medical Weight Management  7. Immunization due Influenza  vaccine administered Encouraged to take Tylenol as needed for fever or muscle aches. - Flu Vaccine QUAD 6+ mos PF IM (Fluarix Quad PF)    Patient was given opportunity to ask questions. Patient verbalized understanding of the plan and was able to repeat key elements of the plan. All questions were answered to their satisfaction.  [name], FNP   I, [name], FNP, have reviewed all documentation for this visit. The documentation on 11/12/20 for the exam, diagnosis, procedures, and orders are all accurate and complete.   IF YOU HAVE BEEN REFERRED TO A SPECIALIST, IT MAY TAKE 1-2 WEEKS TO SCHEDULE/PROCESS THE REFERRAL. IF YOU HAVE NOT HEARD FROM US/SPECIALIST IN TWO WEEKS, PLEASE GIVE [place] A CALL AT [phone].   THE PATIENT IS ENCOURAGED TO PRACTICE  SOCIAL DISTANCING DUE TO THE COVID-19 PANDEMIC.

## 2020-11-13 LAB — HEMOGLOBIN A1C
Est. average glucose Bld gHb Est-mCnc: 134 mg/dL
Hgb A1c MFr Bld: 6.3 % — ABNORMAL HIGH (ref 4.8–5.6)

## 2020-11-18 ENCOUNTER — Other Ambulatory Visit: Payer: Self-pay

## 2020-11-18 ENCOUNTER — Ambulatory Visit: Payer: BC Managed Care – PPO

## 2020-11-18 VITALS — BP 142/86 | HR 86 | Temp 99.1°F | Ht 67.0 in | Wt >= 6400 oz

## 2020-11-18 DIAGNOSIS — R7309 Other abnormal glucose: Secondary | ICD-10-CM

## 2020-11-18 MED ORDER — PEN NEEDLES 32G X 4 MM MISC: 2 refills | Status: DC

## 2020-11-18 MED ORDER — OZEMPIC (0.25 OR 0.5 MG/DOSE) 2 MG/1.5ML ~~LOC~~ SOPN: 0.2500 mg | PEN_INJECTOR | SUBCUTANEOUS | 0 refills | Status: DC

## 2020-11-18 MED ORDER — METFORMIN HCL ER 500 MG PO TB24
500.0000 mg | ORAL_TABLET | Freq: Every day | ORAL | 1 refills | Status: DC
Start: 2020-11-18 — End: 2021-05-12

## 2020-11-18 NOTE — Progress Notes (Signed)
Patient presents today for ozempic teaching.  First dose 0.24m was administered today. Pt understood the instructions. YL,RMA

## 2020-11-19 LAB — CLOSTRIDIUM DIFFICILE EIA: C difficile Toxins A+B, EIA: NEGATIVE

## 2020-11-20 ENCOUNTER — Encounter: Payer: Self-pay | Admitting: Nurse Practitioner

## 2020-11-22 LAB — STOOL CULTURE: E coli, Shiga toxin Assay: NEGATIVE

## 2020-11-24 LAB — OVA AND PARASITE EXAMINATION

## 2020-12-08 ENCOUNTER — Encounter: Payer: Self-pay | Admitting: Nurse Practitioner

## 2020-12-08 ENCOUNTER — Encounter: Payer: Self-pay | Admitting: Gastroenterology

## 2020-12-10 ENCOUNTER — Encounter: Payer: BC Managed Care – PPO | Admitting: Gastroenterology

## 2020-12-24 ENCOUNTER — Other Ambulatory Visit: Payer: Self-pay | Admitting: Nurse Practitioner

## 2020-12-24 DIAGNOSIS — R7309 Other abnormal glucose: Secondary | ICD-10-CM

## 2020-12-24 MED ORDER — OZEMPIC (0.25 OR 0.5 MG/DOSE) 2 MG/1.5ML ~~LOC~~ SOPN
0.2500 mg | PEN_INJECTOR | SUBCUTANEOUS | 0 refills | Status: DC
Start: 2020-12-24 — End: 2021-01-05

## 2020-12-30 ENCOUNTER — Encounter: Payer: BC Managed Care – PPO | Admitting: Gastroenterology

## 2021-01-05 ENCOUNTER — Encounter: Payer: Self-pay | Admitting: Gastroenterology

## 2021-01-05 ENCOUNTER — Ambulatory Visit: Payer: BC Managed Care – PPO | Admitting: Gastroenterology

## 2021-01-05 ENCOUNTER — Other Ambulatory Visit: Payer: Self-pay

## 2021-01-05 ENCOUNTER — Encounter: Payer: Self-pay | Admitting: Nurse Practitioner

## 2021-01-05 VITALS — BP 168/90 | HR 74 | Ht 67.0 in | Wt >= 6400 oz

## 2021-01-05 DIAGNOSIS — Z1211 Encounter for screening for malignant neoplasm of colon: Secondary | ICD-10-CM

## 2021-01-05 DIAGNOSIS — R194 Change in bowel habit: Secondary | ICD-10-CM

## 2021-01-05 DIAGNOSIS — R7309 Other abnormal glucose: Secondary | ICD-10-CM

## 2021-01-05 MED ORDER — OZEMPIC (0.25 OR 0.5 MG/DOSE) 2 MG/1.5ML ~~LOC~~ SOPN: 0.5000 mg | PEN_INJECTOR | SUBCUTANEOUS | 0 refills | Status: DC

## 2021-01-05 NOTE — Patient Instructions (Signed)
If you are age 47 or older, your body mass index should be between 23-30. Your Body mass index is 80.97 kg/m. If this is out of the aforementioned range listed, please consider follow up with your Primary Care Provider.  If you are age 41 or younger, your body mass index should be between 19-25. Your Body mass index is 80.97 kg/m. If this is out of the aformentioned range listed, please consider follow up with your Primary Care Provider.   ________________________________________________________  The [facility] [place] [facility] providers would like to encourage you to use [facility] to communicate with providers for non-urgent requests or questions.  Due to long hold times on the telephone, sending your provider a message by [facility] may be a faster and more efficient way to get a response.  Please allow 48 business hours for a response.  Please remember that this is for non-urgent requests.  _______________________________________________________  Your provider has ordered Cologuard testing as an option for colon cancer screening. This is performed by [employer] and may be out of network with your insurance. PRIOR to completing the test, it is YOUR responsibility to contact your insurance about covered benefits for this test. Your out of pocket expense could be anywhere from $0.00 to $649.00.   When you call to check coverage with your insurer, please provide the following information:   -The ONLY provider of Cologuard is [place] code for Cologuard is [phone].  [employer] NPI # [id number]  -[employer] Tax ID # [id number]   We have already sent your demographic and insurance information to [employer] (phone number [phone]) and they should contact you within the next week regarding your test. If you have not heard from them within the next week, please call our office at [phone].   Due to recent changes in healthcare laws, you may see  the results of your imaging and laboratory studies on MyChart before your provider has had a chance to review them.  We understand that in some cases there may be results that are confusing or concerning to you. Not all laboratory results come back in the same time frame and the provider may be waiting for multiple results in order to interpret others.  Please give [place] 48 hours in order for your provider to thoroughly review all the results before contacting the office for clarification of your results.   It was a pleasure to see you today!  Thank you for trusting me with your gastrointestinal care!

## 2021-01-05 NOTE — Progress Notes (Signed)
[facility] Consult Note:  History: Ralph Cervantes 01/05/2021  Referring provider: [name], [place]  Reason for consult/chief complaint: Colonoscopy (Discuss having a colon done )   Subjective  HPI:  Ralph Cervantes was referred to [place] for colorectal cancer screening Primary care notes indicate patient has been on doxycycline for rosacea since at least November 2021.  He was on metformin in March of this year, though reportedly no longer on it at a September office visit.  He was describing diarrhea at that visit, C. difficile toxin test negative.  He was referred to [place] in March of this year for screening colonoscopy.  CT scan reports from 2011 indicate patient had percutaneous drainage of a left-sided abscess, presumably diverticulitis in nature but no records available about that at this time.  Ralph Cervantes says he saw Dr. Wilford Corner and had a colonoscopy around that time.  His surgeon ("in the same building"-likely [facility]) recommended a surgical procedure, but Ralph Cervantes went for another opinion somewhere in [place] and the problem resolved with antibiotics and no surgery. Ralph Cervantes has been on doxycycline intermittently for 7 or 8 years, says he saw dermatology at some point and feels perhaps he needs to see them again for his rosacea.  He was prescribed metformin earlier this year, but never started taking it until after his September visit with primary care.  That is about when he was started on Ozempic as well. He has had a change in bowel habits, on average 2 soft BMs per day for the last few months.  No loose watery stool or rectal bleeding.  Some days his abdomen is "noisy" with belching that he attributes to reflux. He often has fast food, little alcohol, prior smoker, trucker.  ROS:  Review of Systems  Constitutional:  Negative for appetite change and unexpected weight change.  HENT:  Negative for mouth sores and voice change.   Eyes:  Negative for pain and redness.   Respiratory:  Negative for cough and shortness of breath.   Cardiovascular:  Negative for chest pain and palpitations.  Genitourinary:  Negative for dysuria and hematuria.  Musculoskeletal:  Negative for arthralgias and myalgias.  Skin:  Negative for pallor and rash.  Neurological:  Negative for weakness and headaches.  Hematological:  Negative for adenopathy.    Past Medical History: Past Medical History:  Diagnosis Date   GERD (gastroesophageal reflux disease)    Morbid obesity with BMI of 50.0-59.9, adult (HCC)    Obstructive sleep apnea    Pericarditis      Past Surgical History: Past Surgical History:  Procedure Laterality Date   colon aspiration     abscess in colon aspirated    PERICARDIAL WINDOW       Family History: Family History  Problem Relation Age of Onset   Cancer Father     Social History: Social History   Socioeconomic History   Marital status: Married    Spouse name: Not on file   Number of children: 2   Years of education: Not on file   Highest education level: Not on file  Occupational History   Occupation: trucker  Tobacco Use   Smoking status: Former    Types: Cigarettes    Quit date: 2007    Years since quitting: 15.8   Smokeless tobacco: Never  Vaping Use   Vaping Use: Never used  Substance and Sexual Activity   Alcohol use: Yes   Drug use: No   Sexual activity: Not on file  Other Topics Concern   Not on file  Social History Narrative   Not on file   Social Determinants of Health   Financial Resource Strain: Not on file  Food Insecurity: Not on file  Transportation Needs: Not on file  Physical Activity: Not on file  Stress: Not on file  Social Connections: Not on file    Allergies: No Known Allergies  Outpatient Meds: Current Outpatient Medications  Medication Sig Dispense Refill   atorvastatin (LIPITOR) 10 MG tablet Take 1 tablet (10 mg total) by mouth daily. 30 tablet 2   doxycycline (MONODOX) 100 MG capsule  Take 1 capsule (100 mg total) by mouth daily. May take 1 capsule by mouth 2 times a day for exacerbations 90 capsule 1   omeprazole (PRILOSEC) 40 MG capsule Take 1 capsule (40 mg total) by mouth daily. 90 capsule 1   Probiotic, Lactobacillus, CAPS Take 1 tablet by mouth daily. 30 capsule 2   metFORMIN (GLUCOPHAGE XR) 500 MG 24 hr tablet Take 1 tablet (500 mg total) by mouth daily with breakfast. 90 tablet 1   Semaglutide,0.25 or 0.5MG/DOS, (OZEMPIC, 0.25 OR 0.5 MG/DOSE,) 2 MG/1.5ML SOPN Inject 0.5 mg into the skin once a week. 9 mL 0   No current facility-administered medications for this visit.   Ozempic recently started   ___________________________________________________________________ Objective   Exam:  BP (!) 168/90   Pulse 74   Ht 5' 7"  (1.702 m)   Wt (!) 517 lb (234.5 kg)   BMI 80.97 kg/m  Wt Readings from Last 3 Encounters:  01/05/21 (!) 517 lb (234.5 kg)  11/18/20 (!) 513 lb 9.6 oz (233 kg)  11/12/20 (!) 505 lb (229.1 kg)    General: Ambulatory, pleasant and conversational, gets on exam table without assistance. Eyes: sclera anicteric, no redness ENT: oral mucosa moist without lesions, no cervical or supraclavicular lymphadenopathy CV: RRR without murmur, S1/S2, no JVD, mild bilateral pretibial edema Resp: clear to auscultation bilaterally, normal RR and effort noted GI: soft, morbidly obese, no tenderness, with active bowel sounds.  Unable to assess for mass or hepatosplenomegaly due to body habitus.  Large, nontender non-reducible but nonincarcerated umbilical hernia Skin; rosacea Neuro: awake, alert and oriented x 3. Normal gross motor function and fluent speech  Labs:  CBC Latest Ref Rng & Units 05/07/2020 10/11/2011 09/04/2009  WBC 3.4 - 10.8 x10E3/uL 7.8 8.4 15.6(H)  Hemoglobin 13.0 - 17.7 g/dL 15.2 15.8 13.9  Hematocrit 37.5 - 51.0 % 45.8 51.7 40.6  Platelets 150 - 450 x10E3/uL 235 - 240   CMP Latest Ref Rng & Units 05/07/2020 05/02/2019 10/11/2011  Glucose 65 -  99 mg/dL 106(H) 107(H) 90  BUN 6 - 24 mg/dL 16 16 18   Creatinine 0.76 - 1.27 mg/dL 1.00 1.04 0.96  Sodium 134 - 144 mmol/L 140 144 141  Potassium 3.5 - 5.2 mmol/L 4.4 4.3 4.4  Chloride 96 - 106 mmol/L 103 106 104  CO2 20 - 29 mmol/L 20 23 29   Calcium 8.7 - 10.2 mg/dL 9.3 9.1 9.4  Total Protein 6.0 - 8.5 g/dL 7.3 6.9 7.3  Total Bilirubin 0.0 - 1.2 mg/dL 0.4 0.4 0.5  Alkaline Phos 44 - 121 IU/L 79 82 72  AST 0 - 40 IU/L 24 20 20   ALT 0 - 44 IU/L 28 34 30   Recent stool test negative for O+P and C diff toxin A/B, neg salmonella, shigella, campylobacter, E coli   Assessment: Encounter Diagnoses  Name Primary?   Change in bowel habits  Yes   Special screening for malignant neoplasms, colon    Morbid obesity ([place])     Recent change in bowel habits, possibly intestinal dysbiosis related to long-term (albeit intermittent) antibiotic use for rosacea, he reports that is lately improved on a probiotic recommended by primary care.  Likely dietary factors as well. Regarding colorectal cancer screening, he is at significantly increased respiratory and cardiovascular risk from sedation for endoscopic procedures.  (His BMI is 80 ) therefore, I recommended a Cologuard test, with a colonoscopy if positive. He understood the rationale for this and was agreeable.  However, he wanted to wait until after the first of next year to do it for insurance reasons.  He will therefore contact us at that time and Cologuard order will be placed.  Thank you for the courtesy of this consult.  Please call me with any questions or concerns.  Nelida Meuse III  CC: Referring provider noted above

## 2021-02-18 ENCOUNTER — Ambulatory Visit: Payer: BC Managed Care – PPO | Admitting: Nurse Practitioner

## 2021-02-25 ENCOUNTER — Ambulatory Visit: Payer: BC Managed Care – PPO | Admitting: Nurse Practitioner

## 2021-02-25 ENCOUNTER — Other Ambulatory Visit: Payer: Self-pay

## 2021-02-25 ENCOUNTER — Encounter: Payer: Self-pay | Admitting: Nurse Practitioner

## 2021-02-25 VITALS — BP 136/76 | HR 87 | Temp 98.3°F | Ht 68.2 in | Wt >= 6400 oz

## 2021-02-25 DIAGNOSIS — L853 Xerosis cutis: Secondary | ICD-10-CM | POA: Diagnosis not present

## 2021-02-25 DIAGNOSIS — Z23 Encounter for immunization: Secondary | ICD-10-CM | POA: Diagnosis not present

## 2021-02-25 DIAGNOSIS — Z6841 Body Mass Index (BMI) 40.0 and over, adult: Secondary | ICD-10-CM

## 2021-02-25 DIAGNOSIS — E6609 Other obesity due to excess calories: Secondary | ICD-10-CM | POA: Diagnosis not present

## 2021-02-25 DIAGNOSIS — E785 Hyperlipidemia, unspecified: Secondary | ICD-10-CM

## 2021-02-25 DIAGNOSIS — R7303 Prediabetes: Secondary | ICD-10-CM

## 2021-02-25 LAB — LIPID PANEL
Chol/HDL Ratio: 6.3 ratio — ABNORMAL HIGH (ref 0.0–5.0)
Cholesterol, Total: 157 mg/dL (ref 100–199)
HDL: 25 mg/dL — ABNORMAL LOW (ref >39)
LDL Chol Calc (NIH): 92 mg/dL (ref 0–99)
Triglycerides: 232 mg/dL — ABNORMAL HIGH (ref 0–149)
VLDL Cholesterol Cal: 40 mg/dL (ref 5–40)

## 2021-02-25 LAB — HEMOGLOBIN A1C
Est. average glucose Bld gHb Est-mCnc: 120 mg/dL
Hgb A1c MFr Bld: 5.8 % — ABNORMAL HIGH (ref 4.8–5.6)

## 2021-02-25 MED ORDER — OZEMPIC (0.25 OR 0.5 MG/DOSE) 2 MG/1.5ML ~~LOC~~ SOPN
0.5000 mg | PEN_INJECTOR | SUBCUTANEOUS | 0 refills | Status: DC
Start: 2021-02-25 — End: 2021-05-12

## 2021-02-25 NOTE — Patient Instructions (Addendum)

## 2021-02-25 NOTE — Progress Notes (Signed)
I,Tianna Badgett,acting as a Education administrator for [employer], FNP.,have documented all relevant documentation on the behalf of [name], FNP,as directed by  [name], FNP while in the presence of [name], [place].  This visit occurred during the SARS-CoV-2 public health emergency.  Safety protocols were in place, including screening questions prior to the visit, additional usage of staff PPE, and extensive cleaning of exam room while observing appropriate contact time as indicated for disinfecting solutions.  Subjective:     Patient ID: Ralph Cervantes , male    DOB: 07-Jul-1975 , 45 y.o.   MRN: [id number]   Chief Complaint  Patient presents with   Hyperlipidemia    HPI  Patient here for a f/u on his abnormal glucose cholesterol. He has been having loose stools. He is planning to do the cologuard at the beginning of the year. He reports he had been taking Ozempic 0.25 mg last dose on Monday.    Wt Readings from Last 3 Encounters: 02/25/21 : (!) 503 lb (228.2 kg) 01/05/21 : (!) 517 lb (234.5 kg) 11/18/20 : (!) 513 lb 9.6 oz (233 kg)    Hyperlipidemia This is a chronic problem. The current episode started more than 1 month ago. The problem is uncontrolled. Recent lipid tests were reviewed and are variable. Exacerbating diseases include obesity. He has no history of chronic renal disease. prediabetes. There are no known factors aggravating his hyperlipidemia. Pertinent negatives include no chest pain or shortness of breath. Compliance problems include adherence to exercise, medication side effects and adherence to diet.  Risk factors for coronary artery disease include male sex, obesity, dyslipidemia and a sedentary lifestyle.    Past Medical History:  Diagnosis Date   GERD (gastroesophageal reflux disease)    Morbid obesity with BMI of 50.0-59.9, adult (HCC)    Obstructive sleep apnea    Pericarditis      Family History  Problem Relation Age of Onset   Cancer Father       Current Outpatient Medications:    doxycycline (MONODOX) 100 MG capsule, Take 1 capsule (100 mg total) by mouth daily. May take 1 capsule by mouth 2 times a day for exacerbations, Disp: 90 capsule, Rfl: 1   metFORMIN (GLUCOPHAGE XR) 500 MG 24 hr tablet, Take 1 tablet (500 mg total) by mouth daily with breakfast., Disp: 90 tablet, Rfl: 1   omeprazole (PRILOSEC) 40 MG capsule, Take 1 capsule (40 mg total) by mouth daily., Disp: 90 capsule, Rfl: 1   Probiotic, Lactobacillus, CAPS, Take 1 tablet by mouth daily., Disp: 30 capsule, Rfl: 2   Semaglutide,0.25 or 0.5MG/DOS, (OZEMPIC, 0.25 OR 0.5 MG/DOSE,) 2 MG/1.5ML SOPN, Inject 0.5 mg into the skin once a week., Disp: 9 mL, Rfl: 0   No Known Allergies   Review of Systems  Constitutional: Negative.   Respiratory: Negative.  Negative for shortness of breath.   Cardiovascular: Negative.  Negative for chest pain.  Gastrointestinal: Negative.   Neurological: Negative.     Today's Vitals   02/25/21 0856  BP: 136/76  Pulse: 87  Temp: 98.3 F (36.8 C)  TempSrc: Oral  Weight: (!) 503 lb (228.2 kg)  Height: 5' 8.2" (1.732 m)   Body mass index is 76.03 kg/m.  Wt Readings from Last 3 Encounters:  02/25/21 (!) 503 lb (228.2 kg)  01/05/21 (!) 517 lb (234.5 kg)  11/18/20 (!) 513 lb 9.6 oz (233 kg)    Objective:  Physical Exam Vitals reviewed.  Constitutional:  General: He is not in acute distress.    Appearance: Normal appearance. He is obese.     Comments: Super morbid obesity  Cardiovascular:     Rate and Rhythm: Normal rate and regular rhythm.     Pulses: Normal pulses.     Heart sounds: Normal heart sounds. No murmur heard. Pulmonary:     Effort: Pulmonary effort is normal. No respiratory distress.     Breath sounds: Normal breath sounds. No wheezing.  Skin:    Capillary Refill: Capillary refill takes less than 2 seconds.  Neurological:     General: No focal deficit present.     Mental Status: He is alert and oriented to  person, place, and time.     Cranial Nerves: No cranial nerve deficit.  Psychiatric:        Mood and Affect: Mood normal.        Behavior: Behavior normal.        Thought Content: Thought content normal.        Judgment: Judgment normal.        Assessment And Plan:     1. Prediabetes Comments: Continues to have loose stools with metformin XR, will consider discontinuing as we increase his Ozempic. Increase to 0.5 mg weekly next week. - Hemoglobin A1c - Semaglutide,0.25 or 0.5MG/DOS, (OZEMPIC, 0.25 OR 0.5 MG/DOSE,) 2 MG/1.5ML SOPN; Inject 0.5 mg into the skin once a week.  Dispense: 9 mL; Refill: 0  2. Elevated lipids Comments: Triglycerides were elevated last visit, will recheck today - Lipid panel  3. Encounter for immunization Will give tetanus vaccine today while in office. Refer to order management. TDAP will be administered to adults 33-24 years old every 10 years. - Tdap vaccine greater than or equal to 7yo IM  4. BMI 70 and over, adult [facility]) Chronic Discussed healthy diet and regular exercise options  Encouraged to exercise at least 150 minutes per week with 2 days of strength training Congratulated on his 14 lb weight loss  5. Dry skin Comments: Advised to use Eucerin or Lubriderm to skin for dry areas He is encouraged to initially strive for BMI less than 30 to decrease cardiac risk. He is advised to exercise no less than 150 minutes per week.     Patient was given opportunity to ask questions. Patient verbalized understanding of the plan and was able to repeat key elements of the plan. All questions were answered to their satisfaction.  [name], FNP   I, [name], FNP, have reviewed all documentation for this visit. The documentation on 02/25/21 for the exam, diagnosis, procedures, and orders are all accurate and complete.   IF YOU HAVE BEEN REFERRED TO A SPECIALIST, IT MAY TAKE 1-2 WEEKS TO SCHEDULE/PROCESS THE REFERRAL. IF YOU HAVE NOT HEARD FROM  US/SPECIALIST IN TWO WEEKS, PLEASE GIVE [place] A CALL AT [phone].   THE PATIENT IS ENCOURAGED TO PRACTICE SOCIAL DISTANCING DUE TO THE COVID-19 PANDEMIC.

## 2021-04-10 ENCOUNTER — Telehealth: Payer: Self-pay

## 2021-04-10 NOTE — Telephone Encounter (Signed)
Pt states he has the kit at home and intends to complete this next week.

## 2021-04-10 NOTE — Telephone Encounter (Signed)
-----   Message from [place], MD sent at 04/07/2021 10:01 AM EST ----- Please contact this patient by phone or portal message and see if he is ready to do his Cologuard test. If so, please send the order. If he indicates he is not ready to do so, then kindly ask him to contact us when he does.  HD

## 2021-04-24 ENCOUNTER — Telehealth: Payer: Self-pay

## 2021-04-24 LAB — COLOGUARD: COLOGUARD: NEGATIVE

## 2021-04-24 MED ORDER — ATORVASTATIN CALCIUM 10 MG PO TABS: 10.0000 mg | ORAL_TABLET | Freq: Every day | ORAL | 11 refills | Status: DC

## 2021-04-24 NOTE — Telephone Encounter (Signed)
Pt called. Why atorvastatin discontinued on 02/20/2020. Pt reported being unsure, he did not remember taking medication. Med sent to pharmacy, pt agreed taking. Pt has upcoming appointment with JM on 05/12/2021.

## 2021-05-12 ENCOUNTER — Ambulatory Visit (INDEPENDENT_AMBULATORY_CARE_PROVIDER_SITE_OTHER): Payer: BC Managed Care – PPO | Admitting: Nurse Practitioner

## 2021-05-12 ENCOUNTER — Encounter: Payer: Self-pay | Admitting: Nurse Practitioner

## 2021-05-12 ENCOUNTER — Other Ambulatory Visit: Payer: Self-pay

## 2021-05-12 VITALS — BP 126/84 | HR 88 | Temp 98.1°F | Ht 68.2 in | Wt >= 6400 oz

## 2021-05-12 DIAGNOSIS — H6123 Impacted cerumen, bilateral: Secondary | ICD-10-CM

## 2021-05-12 DIAGNOSIS — J9801 Acute bronchospasm: Secondary | ICD-10-CM

## 2021-05-12 DIAGNOSIS — R7303 Prediabetes: Secondary | ICD-10-CM

## 2021-05-12 DIAGNOSIS — Z79899 Other long term (current) drug therapy: Secondary | ICD-10-CM

## 2021-05-12 DIAGNOSIS — Z6841 Body Mass Index (BMI) 40.0 and over, adult: Secondary | ICD-10-CM

## 2021-05-12 DIAGNOSIS — Z0000 Encounter for general adult medical examination without abnormal findings: Secondary | ICD-10-CM

## 2021-05-12 DIAGNOSIS — Z125 Encounter for screening for malignant neoplasm of prostate: Secondary | ICD-10-CM

## 2021-05-12 LAB — POCT URINALYSIS DIPSTICK
Bilirubin, UA: NEGATIVE
Blood, UA: NEGATIVE
Glucose, UA: NEGATIVE
Ketones, UA: NEGATIVE
Leukocytes, UA: NEGATIVE
Nitrite, UA: NEGATIVE
Protein, UA: NEGATIVE
Spec Grav, UA: 1.025 (ref 1.010–1.025)
Urobilinogen, UA: 0.2 E.U./dL
pH, UA: 6 (ref 5.0–8.0)

## 2021-05-12 MED ORDER — ALBUTEROL SULFATE HFA 108 (90 BASE) MCG/ACT IN AERS: 2.0000 | INHALATION_SPRAY | Freq: Four times a day (QID) | RESPIRATORY_TRACT | 2 refills | Status: AC | PRN

## 2021-05-12 MED ORDER — OZEMPIC (0.25 OR 0.5 MG/DOSE) 2 MG/1.5ML ~~LOC~~ SOPN: 0.5000 mg | PEN_INJECTOR | SUBCUTANEOUS | 0 refills | Status: DC

## 2021-05-12 MED ORDER — METFORMIN HCL ER 500 MG PO TB24: 500.0000 mg | ORAL_TABLET | Freq: Every day | ORAL | 1 refills | Status: AC

## 2021-05-12 NOTE — Patient Instructions (Addendum)
Health Maintenance, Male Adopting a healthy lifestyle and getting preventive care are important in promoting health and wellness. Ask your health care provider about: The right schedule for you to have regular tests and exams. Things you can do on your own to prevent diseases and keep yourself healthy. What should I know about diet, weight, and exercise? Eat a healthy diet  Eat a diet that includes plenty of vegetables, fruits, low-fat dairy products, and lean protein. Do not eat a lot of foods that are high in solid fats, added sugars, or sodium. Maintain a healthy weight Body mass index (BMI) is a measurement that can be used to identify possible weight problems. It estimates body fat based on height and weight. Your health care provider can help determine your BMI and help you achieve or maintain a healthy weight. Get regular exercise Get regular exercise. This is one of the most important things you can do for your health. Most adults should: Exercise for at least 150 minutes each week. The exercise should increase your heart rate and make you sweat (moderate-intensity exercise). Do strengthening exercises at least twice a week. This is in addition to the moderate-intensity exercise. Spend less time sitting. Even light physical activity can be beneficial. Watch cholesterol and blood lipids Have your blood tested for lipids and cholesterol at 48 years of age, then have this test every 5 years. You may need to have your cholesterol levels checked more often if: Your lipid or cholesterol levels are high. You are older than 48 years of age. You are at high risk for heart disease. What should I know about cancer screening? Many types of cancers can be detected early and may often be prevented. Depending on your health history and family history, you may need to have cancer screening at various ages. This may include screening for: Colorectal cancer. Prostate cancer. Skin cancer. Lung  cancer. What should I know about heart disease, diabetes, and high blood pressure? Blood pressure and heart disease High blood pressure causes heart disease and increases the risk of stroke. This is more likely to develop in people who have high blood pressure readings or are overweight. Talk with your health care provider about your target blood pressure readings. Have your blood pressure checked: Every 3-5 years if you are 49-6 years of age. Every year if you are 78 years old or older. If you are between the ages of 13 and 61 and are a current or former smoker, ask your health care provider if you should have a one-time screening for abdominal aortic aneurysm (AAA). Diabetes Have regular diabetes screenings. This checks your fasting blood sugar level. Have the screening done: Once every three years after age 60 if you are at a normal weight and have a low risk for diabetes. More often and at a younger age if you are overweight or have a high risk for diabetes. What should I know about preventing infection? Hepatitis B If you have a higher risk for hepatitis B, you should be screened for this virus. Talk with your health care provider to find out if you are at risk for hepatitis B infection. Hepatitis C Blood testing is recommended for: Everyone born from [birth year] through 1967. Anyone with known risk factors for hepatitis C. Sexually transmitted infections (STIs) You should be screened each year for STIs, including gonorrhea and chlamydia, if: You are sexually active and are younger than 48 years of age. You are older than 48 years of age and your  health care provider tells you that you are at risk for this type of infection. Your sexual activity has changed since you were last screened, and you are at increased risk for chlamydia or gonorrhea. Ask your health care provider if you are at risk. Ask your health care provider about whether you are at high risk for HIV. Your health care provider  may recommend a prescription medicine to help prevent HIV infection. If you choose to take medicine to prevent HIV, you should first get tested for HIV. You should then be tested every 3 months for as long as you are taking the medicine. Follow these instructions at home: Alcohol use Do not drink alcohol if your health care provider tells you not to drink. If you drink alcohol: Limit how much you have to 0-2 drinks a day. Know how much alcohol is in your drink. In the [place] one drink equals one 12 oz bottle of beer (355 mL), one 5 oz glass of wine (148 mL), or one 1 oz glass of hard liquor (44 mL). Lifestyle Do not use any products that contain nicotine or tobacco. These products include cigarettes, chewing tobacco, and vaping devices, such as e-cigarettes. If you need help quitting, ask your health care provider. Do not use street drugs. Do not share needles. Ask your health care provider for help if you need support or information about quitting drugs. General instructions Schedule regular health, dental, and eye exams. Stay current with your vaccines. Tell your health care provider if: You often feel depressed. You have ever been abused or do not feel safe at home. Summary Adopting a healthy lifestyle and getting preventive care are important in promoting health and wellness. Follow your health care provider's instructions about healthy diet, exercising, and getting tested or screened for diseases. Follow your health care provider's instructions on monitoring your cholesterol and blood pressure. This information is not intended to replace advice given to you by your health care provider. Make sure you discuss any questions you have with your health care provider. Document Revised: 07/14/2020 Document Reviewed: 07/14/2020 Elsevier Patient Education  2022 [place], Adult The ears produce a substance called earwax that helps keep bacteria out of the ear and  protects the skin in the ear canal. Occasionally, earwax can build up in the ear and cause discomfort or hearing loss. What are the causes? This condition is caused by a buildup of earwax. Ear canals are self-cleaning. Ear wax is made in the outer part of the ear canal and generally falls out in small amounts over time. When the self-cleaning mechanism is not working, earwax builds up and can cause decreased hearing and discomfort. Attempting to clean ears with cotton swabs can push the earwax deep into the ear canal and cause decreased hearing and pain. What increases the risk? This condition is more likely to develop in people who: Clean their ears often with cotton swabs. Pick at their ears. Use earplugs or in-ear headphones often, or wear hearing aids. The following factors may also make you more likely to develop this condition: Being male. Being of older age. Naturally producing more earwax. Having narrow ear canals. Having earwax that is overly thick or sticky. Having excess hair in the ear canal. Having eczema. Being dehydrated. What are the signs or symptoms? Symptoms of this condition include: Reduced or muffled hearing. A feeling of fullness in the ear or feeling that the ear is plugged. Fluid coming from the ear. Ear  pain or an itchy ear. Ringing in the ear. Coughing. Balance problems. An obvious piece of earwax that can be seen inside the ear canal. How is this diagnosed? This condition may be diagnosed based on: Your symptoms. Your medical history. An ear exam. During the exam, your health care provider will look into your ear with an instrument called an otoscope. You may have tests, including a hearing test. How is this treated? This condition may be treated by: Using ear drops to soften the earwax. Having the earwax removed by a health care provider. The health care provider may: Flush the ear with water. Use an instrument that has a loop on the end  (curette). Use a suction device. Having surgery to remove the wax buildup. This may be done in severe cases. Follow these instructions at home:  Take over-the-counter and prescription medicines only as told by your health care provider. Do not put any objects, including cotton swabs, into your ear. You can clean the opening of your ear canal with a washcloth or facial tissue. Follow instructions from your health care provider about cleaning your ears. Do not overclean your ears. Drink enough fluid to keep your urine pale yellow. This will help to thin the earwax. Keep all follow-up visits as told. If earwax builds up in your ears often or if you use hearing aids, consider seeing your health care provider for routine, preventive ear cleanings. Ask your health care provider how often you should schedule your cleanings. If you have hearing aids, clean them according to instructions from the manufacturer and your health care provider. Contact a health care provider if: You have ear pain. You develop a fever. You have pus or other fluid coming from your ear. You have hearing loss. You have ringing in your ears that does not go away. You feel like the room is spinning (vertigo). Your symptoms do not improve with treatment. Get help right away if: You have bleeding from the affected ear. You have severe ear pain. Summary Earwax can build up in the ear and cause discomfort or hearing loss. The most common symptoms of this condition include reduced or muffled hearing, a feeling of fullness in the ear, or feeling that the ear is plugged. This condition may be diagnosed based on your symptoms, your medical history, and an ear exam. This condition may be treated by using ear drops to soften the earwax or by having the earwax removed by a health care provider. Do not put any objects, including cotton swabs, into your ear. You can clean the opening of your ear canal with a washcloth or facial  tissue. This information is not intended to replace advice given to you by your health care provider. Make sure you discuss any questions you have with your health care provider. Document Revised: 06/12/2019 Document Reviewed: 06/12/2019 Elsevier Patient Education  [place] ears with debrox drops and can use 1/2 peroxide and 1/2 water.   [facility] - [place] [phone]

## 2021-05-12 NOTE — Progress Notes (Signed)
?[name] as a Education administrator for [employer], FNP.,have documented all relevant documentation on the behalf of Ralph Brine, FNP,as directed by  Ralph Brine, FNP while in the presence of Ralph Cervantes, [place].  ? ?This visit occurred during the SARS-CoV-2 public health emergency.  Safety protocols were in place, including screening questions prior to the visit, additional usage of staff PPE, and extensive cleaning of exam room while observing appropriate contact time as indicated for disinfecting solutions. ? ?Subjective:  ?  ? Patient ID: Ralph Cervantes , male    DOB: 1975-06-08 , 46 y.o.   MRN: [id number] ? ? ?Chief Complaint  ?Patient presents with  ? Annual Exam  ? ? ?HPI ? ?Patient is here for physical exam. ? ?The patient is going a trip on a cruise and would like to have a rescue inhaler to take with him due to risk of exposure to cigarette smoke. ? ?Wt Readings from Last 3 Encounters: ?05/12/21 : (!) 502 lb 12.8 oz (228.1 kg) ?02/25/21 : (!) 503 lb (228.2 kg) ?01/05/21 : (!) 517 lb (234.5 kg) ? ? ?  ? ?Past Medical History:  ?Diagnosis Date  ? GERD (gastroesophageal reflux disease)   ? Morbid obesity with BMI of 50.0-59.9, adult ([place])   ? Obstructive sleep apnea   ? Pericarditis   ?  ? ?Family History  ?Problem Relation Age of Onset  ? Cancer Father   ? ? ? ?Current Outpatient Medications:  ?  atorvastatin (LIPITOR) 10 MG tablet, Take 1 tablet (10 mg total) by mouth daily., Disp: 30 tablet, Rfl: 11 ?  doxycycline (MONODOX) 100 MG capsule, Take 1 capsule (100 mg total) by mouth daily. May take 1 capsule by mouth 2 times a day for exacerbations, Disp: 90 capsule, Rfl: 1 ?  omeprazole (PRILOSEC) 40 MG capsule, Take 1 capsule (40 mg total) by mouth daily., Disp: 90 capsule, Rfl: 1 ?  Probiotic, Lactobacillus, CAPS, Take 1 tablet by mouth daily., Disp: 30 capsule, Rfl: 2 ?  albuterol (VENTOLIN HFA) 108 (90 Base) MCG/ACT inhaler, Inhale 2 puffs into the lungs every 6 (six) hours as needed for wheezing or  shortness of breath., Disp: 18 g, Rfl: 2 ?  metFORMIN (GLUCOPHAGE XR) 500 MG 24 hr tablet, Take 1 tablet (500 mg total) by mouth daily with breakfast., Disp: 90 tablet, Rfl: 1 ?  Semaglutide,0.25 or 0.5MG/DOS, (OZEMPIC, 0.25 OR 0.5 MG/DOSE,) 2 MG/1.5ML SOPN, Inject 0.5 mg into the skin once a week., Disp: 9 mL, Rfl: 0  ? ?No Known Allergies  ? ?Men's preventive visit. Patient Health Questionnaire (PHQ-2) is  ?[place] [facility] Visit from 05/12/2021 in [facility]  ?PHQ-2 Total Score 0  ? ?  ?Patient is on a Regular diet. 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 Marital status: Married. Relevant history for alcohol use is:  ?Social History  ? ?Substance and Sexual Activity  ?Alcohol Use Yes  ?Marland Kitchen Relevant history for tobacco use is:  ?Social History  ? ?Tobacco Use  ?Smoking Status Former  ? Types: Cigarettes  ? Quit date: 2007  ? Years since quitting: 16.1  ?Smokeless Tobacco Never  ?.  ? ?Review of Systems  ?HENT: Negative.    ?Eyes: Negative.   ?Respiratory: Negative.    ?Cardiovascular: Negative.   ?Gastrointestinal: Negative.   ?Endocrine: Negative.   ?Genitourinary: Negative.   ?Musculoskeletal: Negative.   ?Allergic/Immunologic: Negative.   ?Neurological: Negative.   ?Hematological: Negative.   ?Psychiatric/Behavioral: Negative.     ? ?  Today's Vitals  ? 05/12/21 0836  ?BP: 126/84  ?Pulse: 88  ?Temp: 98.1 ?F (36.7 ?C)  ?Weight: (!) 502 lb 12.8 oz (228.1 kg)  ?Height: 5' 8.2" (1.732 m)  ? ?Body mass index is 76 kg/m?.  ?Wt Readings from Last 3 Encounters:  ?05/12/21 (!) 502 lb 12.8 oz (228.1 kg)  ?02/25/21 (!) 503 lb (228.2 kg)  ?01/05/21 (!) 517 lb (234.5 kg)  ?  ?BP Readings from Last 3 Encounters:  ?05/12/21 126/84  ?02/25/21 136/76  ?01/05/21 (!) 168/90  ?  ?Objective:  ?Physical Exam ?Vitals reviewed.  ?Constitutional:   ?   General: He is not in acute distress. ?   Appearance: Normal appearance.  ?   Comments: Super morbid obese  ?HENT:  ?   Head: Normocephalic and atraumatic.   ?   Right Ear: Tympanic membrane, ear canal and external ear normal. There is no impacted cerumen.  ?   Left Ear: Tympanic membrane, ear canal and external ear normal. There is no impacted cerumen.  ?   Nose:  ?   Comments: Deferred - masked ?   Mouth/Throat:  ?   Comments: Deferred - masked ?Eyes:  ?   Pupils: Pupils are equal, round, and reactive to light.  ?Cardiovascular:  ?   Rate and Rhythm: Normal rate and regular rhythm.  ?   Pulses: Normal pulses.  ?   Heart sounds: Normal heart sounds. No murmur heard. ?Pulmonary:  ?   Effort: Pulmonary effort is normal. No respiratory distress.  ?   Breath sounds: Normal breath sounds.  ?Abdominal:  ?   General: Abdomen is flat. Bowel sounds are normal. There is no distension.  ?   Palpations: Abdomen is soft. There is no mass.  ?   Tenderness: There is no abdominal tenderness.  ?Genitourinary: ?   Comments: Deferred  ?Musculoskeletal:     ?   General: No swelling or tenderness. Normal range of motion.  ?   Cervical back: Normal range of motion and neck supple.  ?Skin: ?   General: Skin is warm.  ?   Capillary Refill: Capillary refill takes less than 2 seconds.  ?Neurological:  ?   General: No focal deficit present.  ?   Mental Status: He is alert and oriented to person, place, and time.  ?   Cranial Nerves: No cranial nerve deficit.  ?   Motor: No weakness.  ?Psychiatric:     ?   Mood and Affect: Mood normal.     ?   Behavior: Behavior normal.     ?   Thought Content: Thought content normal.     ?   Judgment: Judgment normal.  ?  ? ?   ?Assessment And Plan:  ?  ?1. Encounter for annual physical exam ?Behavior modifications discussed and diet history reviewed.   ?Pt will continue to exercise regularly and modify diet with low GI, plant based foods and decrease intake of processed foods.  ?Recommend intake of daily multivitamin, Vitamin D, and calcium.  ?Recommend cologuard was done (negative) for preventive screenings, as well as recommend immunizations that include  influenza, TDAP ? ?2. Prediabetes ?Comments: HgbA1c is slightly better at last visit, metformin does cause some GI upset and is tolerating Ozempic well ?- POCT Urinalysis Dipstick (68032) ?- Microalbumin / Creatinine Urine Ratio ?- Hemoglobin A1c ?- CMP14+EGFR ?- Lipid panel ?- metFORMIN (GLUCOPHAGE XR) 500 MG 24 hr tablet; Take 1 tablet (500 mg total) by mouth daily with breakfast.  Dispense: 90 tablet; Refill: 1 ?- Semaglutide,0.25 or 0.5MG/DOS, (OZEMPIC, 0.25 OR 0.5 MG/DOSE,) 2 MG/1.5ML SOPN; Inject 0.5 mg into the skin once a week.  Dispense: 9 mL; Refill: 0 ? ?3. BMI 70 and over, adult [facility]) ?Comments: I have given him the phone numer to [facility], he has maintained his weight but needs more structured regimen.  He is encouraged to initially strive for BMI less than 30 to decrease cardiac risk. He is advised to exercise no less than 150 minutes per week.   ? ?4. Bronchospasm ?Comments: Since having covid has coughing episodes requiring albuterol inhaler, Rx sent to pharmacy ?- albuterol (VENTOLIN HFA) 108 (90 Base) MCG/ACT inhaler; Inhale 2 puffs into the lungs every 6 (six) hours as needed for wheezing or shortness of breath.  Dispense: 18 g; Refill: 2 ? ?5. Bilateral impacted cerumen ?Comments: Also removed cerumen with curette was unsuccessful, then had water lavage with better success ?- Ear Lavage ? ?6. Encounter for prostate cancer screening ?- PSA ? ?7. Other long term (current) drug therapy ?- CBC ? ? ? ?Patient was given opportunity to ask questions. Patient verbalized understanding of the plan and was able to repeat key elements of the plan. All questions were answered to their satisfaction.  ? ?Ralph Brine, FNP  ? ?I, Ralph Brine, FNP, have reviewed all documentation for this visit. The documentation on 05/12/21 for the exam, diagnosis, procedures, and orders are all accurate and complete.  ? ?THE PATIENT IS ENCOURAGED TO PRACTICE SOCIAL DISTANCING DUE TO THE COVID-19  PANDEMIC.   ?

## 2021-05-13 LAB — CBC
Hematocrit: 46.5 % (ref 37.5–51.0)
Hemoglobin: 15.1 g/dL (ref 13.0–17.7)
MCH: 29 pg (ref 26.6–33.0)
MCHC: 32.5 g/dL (ref 31.5–35.7)
MCV: 89 fL (ref 79–97)
Platelets: 245 10*3/uL (ref 150–450)
RBC: 5.21 x10E6/uL (ref 4.14–5.80)
RDW: 12.9 % (ref 11.6–15.4)
WBC: 7.3 10*3/uL (ref 3.4–10.8)

## 2021-05-13 LAB — CMP14+EGFR
ALT: 29 IU/L (ref 0–44)
AST: 24 IU/L (ref 0–40)
Albumin/Globulin Ratio: 1.5 (ref 1.2–2.2)
Albumin: 4.2 g/dL (ref 4.0–5.0)
Alkaline Phosphatase: 79 IU/L (ref 44–121)
BUN/Creatinine Ratio: 16 (ref 9–20)
BUN: 15 mg/dL (ref 6–24)
Bilirubin Total: 0.3 mg/dL (ref 0.0–1.2)
CO2: 24 mmol/L (ref 20–29)
Calcium: 9.6 mg/dL (ref 8.7–10.2)
Chloride: 99 mmol/L (ref 96–106)
Creatinine, Ser: 0.95 mg/dL (ref 0.76–1.27)
Globulin, Total: 2.8 g/dL (ref 1.5–4.5)
Glucose: 95 mg/dL (ref 70–99)
Potassium: 4.4 mmol/L (ref 3.5–5.2)
Sodium: 140 mmol/L (ref 134–144)
Total Protein: 7 g/dL (ref 6.0–8.5)
eGFR: 99 mL/min/{1.73_m2} (ref >59)

## 2021-05-13 LAB — MICROALBUMIN / CREATININE URINE RATIO
Creatinine, Urine: 153.4 mg/dL
Microalb/Creat Ratio: 9 mg/g creat (ref 0–29)
Microalbumin, Urine: 13.5 ug/mL

## 2021-05-13 LAB — HEMOGLOBIN A1C
Est. average glucose Bld gHb Est-mCnc: 117 mg/dL
Hgb A1c MFr Bld: 5.7 % — ABNORMAL HIGH (ref 4.8–5.6)

## 2021-05-13 LAB — LIPID PANEL
Chol/HDL Ratio: 4.7 ratio (ref 0.0–5.0)
Cholesterol, Total: 127 mg/dL (ref 100–199)
HDL: 27 mg/dL — ABNORMAL LOW (ref >39)
LDL Chol Calc (NIH): 70 mg/dL (ref 0–99)
Triglycerides: 174 mg/dL — ABNORMAL HIGH (ref 0–149)
VLDL Cholesterol Cal: 30 mg/dL (ref 5–40)

## 2021-05-13 LAB — PSA: Prostate Specific Ag, Serum: 0.3 ng/mL (ref 0.0–4.0)

## 2021-05-27 ENCOUNTER — Other Ambulatory Visit: Payer: Self-pay | Admitting: Nurse Practitioner

## 2021-07-07 ENCOUNTER — Telehealth: Payer: Self-pay

## 2021-07-07 NOTE — Telephone Encounter (Signed)
Left pt vm to give the office a call back. Received a fax from [facility] pts pharmacy. Metformin 500 has not been refilled recently , has pt stopped using or does he still take med as directed?  ?

## 2021-07-14 ENCOUNTER — Encounter: Payer: Self-pay | Admitting: Nurse Practitioner

## 2021-09-14 ENCOUNTER — Ambulatory Visit: Payer: BC Managed Care – PPO | Admitting: Nurse Practitioner

## 2021-09-23 ENCOUNTER — Encounter: Payer: Self-pay | Admitting: Nurse Practitioner

## 2021-11-10 ENCOUNTER — Other Ambulatory Visit: Payer: Self-pay | Admitting: Nurse Practitioner

## 2021-11-11 ENCOUNTER — Ambulatory Visit: Payer: BC Managed Care – PPO | Admitting: Nurse Practitioner

## 2021-11-11 ENCOUNTER — Encounter: Payer: Self-pay | Admitting: Nurse Practitioner

## 2021-11-11 VITALS — BP 130/72 | HR 92 | Temp 97.9°F | Ht 68.2 in | Wt >= 6400 oz

## 2021-11-11 DIAGNOSIS — E785 Hyperlipidemia, unspecified: Secondary | ICD-10-CM | POA: Diagnosis not present

## 2021-11-11 DIAGNOSIS — R7303 Prediabetes: Secondary | ICD-10-CM | POA: Diagnosis not present

## 2021-11-11 DIAGNOSIS — Z23 Encounter for immunization: Secondary | ICD-10-CM

## 2021-11-11 DIAGNOSIS — Z6841 Body Mass Index (BMI) 40.0 and over, adult: Secondary | ICD-10-CM

## 2021-11-11 MED ORDER — WEGOVY 1.7 MG/0.75ML ~~LOC~~ SOAJ: 1.7000 mg | SUBCUTANEOUS | 1 refills | Status: DC

## 2021-11-11 NOTE — Patient Instructions (Addendum)
Preventing Type 2 Diabetes Mellitus Type 2 diabetes, also called type 2 diabetes mellitus, is a long-term (chronic) disease that affects sugar (glucose) levels in your blood. Normally, a hormone called insulin allows glucose to enter cells in your body. The cells use glucose for energy. With type 2 diabetes, you will have one or both of these problems: Your pancreas does not make enough insulin. Cells in your body do not respond properly to insulin that your body makes (insulin resistance). Insulin resistance or lack of insulin causes extra glucose to build up in the blood instead of going into cells. As a result, high blood glucose (hyperglycemia) develops. That can cause many complications. Being overweight or obese and having an inactive (sedentary) lifestyle can increase your risk for diabetes. Type 2 diabetes can be delayed or prevented by making certain nutrition and lifestyle changes. How can this condition affect me? If you do not take steps to prevent diabetes, your blood glucose levels may keep increasing over time. Too much glucose in your blood for a long time can damage your blood vessels, heart, kidneys, nerves, and eyes. Type 2 diabetes can lead to chronic health problems and complications, such as: Heart disease. Stroke. Blindness. Kidney disease. Depression. Poor circulation in your feet and legs. In severe cases, a foot or leg may need to be surgically removed (amputated). What can increase my risk? You may be more likely to develop type 2 diabetes if you: Have type 2 diabetes in your family. Are overweight or obese. Have a sedentary lifestyle. Have insulin resistance or a history of prediabetes. Have a history of pregnancy-related (gestational) diabetes or polycystic ovary syndrome (PCOS). What actions can I take to prevent this? It can be difficult to recognize signs of type 2 diabetes. Taking action to prevent the disease before you develop symptoms is the best way to avoid  possible damage to your body. Making certain nutrition and lifestyle changes may prevent or delay the disease and related health problems. Nutrition  Eat healthy meals and snacks regularly. Do not skip meals. Fruit or a handful of nuts is a healthy snack between meals. Drink water throughout the day. Avoid drinks that contain added sugar, such as soda or sweetened tea. Drink enough fluid to keep your urine pale yellow. Follow instructions from your health care provider about eating or drinking restrictions. Limit the amount of food you eat by: Managing how much you eat at a time (portion size). Checking food labels for the serving sizes of food. Using a kitchen scale to weigh amounts of food. Saut or steam food instead of frying it. Cook with water or broth instead of oils or butter. Limit saturated fat and salt (sodium) in your diet. Have no more than 1 tsp (2,400 mg) of sodium a day. If you have heart disease or high blood pressure, use less than ? tsp (1,500 mg) of sodium a day. Lifestyle  Lose weight if needed and as told. Your health care provider can determine how much weight loss is best for you and can help you lose weight safely. If you are overweight or obese, you may be told to lose at least 5?7% of your body weight. Manage blood pressure, cholesterol, and stress. Your health care provider will help determine the best treatment for you. Do not use any products that contain nicotine or tobacco. These products include cigarettes, chewing tobacco, and vaping devices, such as e-cigarettes. If you need help quitting, ask your health care provider. Activity  Do physical   activity that makes your heart beat faster and makes you sweat (moderate intensity). Do this for at least 30 minutes on at least 5 days of the week, or as much as told by your health care provider. Ask your health care provider what activities are safe for you. A mix of activities may be best, such as walking, swimming,  cycling, and strength training. Try to add physical activity into your day. For example: Park your car farther away than usual so that you walk more. Take a walk during your lunch break. Use stairs instead of elevators or escalators. Walk or bike to work instead of driving. Alcohol use If you drink alcohol: Limit how much you have to: 0?1 drink a day for women who are not pregnant. 0?2 drinks a day for men. Know how much alcohol is in your drink. In the [place] one drink equals one 12 oz bottle of beer (355 mL), one 5 oz glass of wine (148 mL), or one 1 oz glass of hard liquor (44 mL). General information Talk with your health care provider about your risk factors and how you can reduce your risk for diabetes. Have your blood glucose tested regularly, as told by your health care provider. Get screening tests as told by your health care provider. You may have these regularly, especially if you have certain risk factors for type 2 diabetes. Make an appointment with a registered dietitian. This diet and nutrition specialist can help you make a healthy eating plan and help you understand portion sizes and food labels. Where to find support Ask your health care provider to recommend a registered dietitian, a certified diabetes care and education specialist, or a weight loss program. Look for local or online weight loss groups. Join a gym, fitness club, or outdoor activity group, such as a walking club. Where to find more information For help and guidance and to learn more about diabetes and diabetes prevention, visit: American Diabetes Association (ADA): www.diabetes.[employer] of Diabetes and Digestive and Kidney Diseases: CarFlippers.tn To learn more about healthy eating, visit: U.S. Department of Agriculture Architect): https://ball-collins.biz/ Office of Disease Prevention and Health Promotion (ODPHP): ResearchName.uy Summary You can delay or prevent type 2 diabetes by eating healthy  foods, losing weight if needed, and increasing your physical activity. Talk with your health care provider about your risk factors for type 2 diabetes and how you can reduce your risk. It can be difficult to recognize the signs of type 2 diabetes. The best way to avoid possible damage to your body is to take action to prevent the disease before you develop symptoms. Get screening tests as told by your health care provider. This information is not intended to replace advice given to you by your health care provider. Make sure you discuss any questions you have with your health care provider. Document Revised: 05/19/2020 Document Reviewed: 05/19/2020 Elsevier Patient Education  2023 Elsevier Inc.   Influenza (Flu) Vaccine (Inactivated or Recombinant): What You Need to Know 1. Why get vaccinated? Influenza vaccine can prevent influenza (flu). Flu is a contagious disease that spreads around the [place] every year, usually between October and May. Anyone can get the flu, but it is more dangerous for some people. Infants and young children, people 21 years and older, pregnant people, and people with certain health conditions or a weakened immune system are at greatest risk of flu complications. Pneumonia, bronchitis, sinus infections, and ear infections are examples of flu-related complications. If you have a medical  condition, such as heart disease, cancer, or diabetes, flu can make it worse. Flu can cause fever and chills, sore throat, muscle aches, fatigue, cough, headache, and runny or stuffy nose. Some people may have vomiting and diarrhea, though this is more common in children than adults. In an average year, thousands of people in the [place] die from flu, and many more are hospitalized. Flu vaccine prevents millions of illnesses and flu-related visits to the doctor each year. 2. Influenza vaccines CDC recommends everyone 6 months and older get vaccinated every flu season. Children 6  months through 41 years of age may need 2 doses during a single flu season. Everyone else needs only 1 dose each flu season. It takes about 2 weeks for protection to develop after vaccination. There are many flu viruses, and they are always changing. Each year a new flu vaccine is made to protect against the influenza viruses believed to be likely to cause disease in the upcoming flu season. Even when the vaccine doesn't exactly match these viruses, it may still provide some protection. Influenza vaccine does not cause flu. Influenza vaccine may be given at the same time as other vaccines. 3. Talk with your health care provider Tell your vaccination provider if the person getting the vaccine: Has had an allergic reaction after a previous dose of influenza vaccine, or has any severe, life-threatening allergies Has ever had Guillain-Barr Syndrome (also called "GBS") In some cases, your health care provider may decide to postpone influenza vaccination until a future visit. Influenza vaccine can be administered at any time during pregnancy. People who are or will be pregnant during influenza season should receive inactivated influenza vaccine. People with minor illnesses, such as a cold, may be vaccinated. People who are moderately or severely ill should usually wait until they recover before getting influenza vaccine. Your health care provider can give you more information. 4. Risks of a vaccine reaction Soreness, redness, and swelling where the shot is given, fever, muscle aches, and headache can happen after influenza vaccination. There may be a very small increased risk of Guillain-Barr Syndrome (GBS) after inactivated influenza vaccine (the flu shot). Young children who get the flu shot along with pneumococcal vaccine (PCV13) and/or DTaP vaccine at the same time might be slightly more likely to have a seizure caused by fever. Tell your health care provider if a child who is getting flu vaccine has  ever had a seizure. People sometimes faint after medical procedures, including vaccination. Tell your provider if you feel dizzy or have vision changes or ringing in the ears. As with any medicine, there is a very remote chance of a vaccine causing a severe allergic reaction, other serious injury, or death. 5. What if there is a serious problem? An allergic reaction could occur after the vaccinated person leaves the clinic. If you see signs of a severe allergic reaction (hives, swelling of the face and throat, difficulty breathing, a fast heartbeat, dizziness, or weakness), call 9-1-1 and get the person to the nearest hospital. For other signs that concern you, call your health care provider. Adverse reactions should be reported to the Vaccine Adverse Event Reporting System (VAERS). Your health care provider will usually file this report, or you can do it yourself. Visit the VAERS website at www.vaers.LAgents.no or call 225-440-4719. VAERS is only for reporting reactions, and VAERS staff members do not give medical advice. 6. The National Vaccine Injury Compensation Program The [employer] Vaccine Injury Compensation Program (VICP) is a Stage manager  that was created to compensate people who may have been injured by certain vaccines. Claims regarding alleged injury or death due to vaccination have a time limit for filing, which may be as short as two years. Visit the VICP website at SpiritualWord.at or call (785)883-8374 to learn about the program and about filing a claim. 7. How can I learn more? Ask your health care provider. Call your local or state health department. Visit the website of the Food and Drug Administration (FDA) for vaccine package inserts and additional information at FinderList.no. Contact the Centers for Disease Control and Prevention (CDC): Call 206-718-6509 (1-800-CDC-INFO) or Visit CDC's website at BiotechRoom.com.cy. Source: CDC  Vaccine Information Statement Inactivated Influenza Vaccine (10/12/2019) This same material is available at FootballExhibition.com.br for no charge. This information is not intended to replace advice given to you by your health care provider. Make sure you discuss any questions you have with your health care provider. Document Revised: 01/21/2021 Document Reviewed: 11/13/2020 Elsevier Patient Education  2023 [employer].

## 2021-11-11 NOTE — Progress Notes (Signed)
I,Tianna Badgett,acting as a Neurosurgeon for [employer], FNP.,have documented all relevant documentation on the behalf of Arnette Felts, FNP,as directed by  Arnette Felts, FNP while in the presence of Arnette Felts, FNP.  Subjective:     Patient ID: Ralph Cervantes , male    DOB: [birth year] , 46 y.o.   MRN: [id number]   Chief Complaint  Patient presents with   Prediabetes    HPI  Patient here for a f/u on his abnormal glucose and cholesterol. Declines missing any weeks for the Ozempic. He has watched the video for bariatric clinic recently but has not set up an appt.  He is exercising with his job, household chores, mowing the grass and walking the dog. Reports he is not snacking in his truck and he is cutting back on drinking sodas - 2-3 cans per day. One coffee with nondairy creamer and splenda a day. He does drink water approximately - 100 oz a day. Last 24 hour food diary - dinner - steak, scallops and corn on cob, Lunch 2 hot dogs, breakfast had 2 yogurt.   [employer] in morning with 2 hot dogs with relish, 2 slices of pizza or 5 - 7 chicken wings, protein in air fryer, eats pork, broccoli, zucchini, potatoes  Hyperlipidemia This is a chronic problem. The current episode started more than 1 month ago. The problem is uncontrolled. Recent lipid tests were reviewed and are variable. Exacerbating diseases include obesity. He has no history of chronic renal disease. prediabetes. There are no known factors aggravating his hyperlipidemia. Pertinent negatives include no chest pain or shortness of breath. Compliance problems include adherence to exercise, medication side effects and adherence to diet.  Risk factors for coronary artery disease include male sex, obesity, dyslipidemia and a sedentary lifestyle.     Past Medical History:  Diagnosis Date   GERD (gastroesophageal reflux disease)    Morbid obesity with BMI of 50.0-59.9, adult (HCC)    Obstructive sleep apnea    Pericarditis      Family  History  Problem Relation Age of Onset   Cancer Father      Current Outpatient Medications:    albuterol (VENTOLIN HFA) 108 (90 Base) MCG/ACT inhaler, Inhale 2 puffs into the lungs every 6 (six) hours as needed for wheezing or shortness of breath., Disp: 18 g, Rfl: 2   atorvastatin (LIPITOR) 10 MG tablet, Take 1 tablet (10 mg total) by mouth daily., Disp: 30 tablet, Rfl: 11   doxycycline (MONODOX) 100 MG capsule, Take 1 capsule (100 mg total) by mouth daily. May take 1 capsule by mouth 2 times a day for exacerbations, Disp: 90 capsule, Rfl: 1   metFORMIN (GLUCOPHAGE XR) 500 MG 24 hr tablet, Take 1 tablet (500 mg total) by mouth daily with breakfast., Disp: 90 tablet, Rfl: 1   omeprazole (PRILOSEC) 40 MG capsule, TAKE ONE CAPSULE BY MOUTH DAILY, Disp: 90 capsule, Rfl: 1   Semaglutide-Weight Management (WEGOVY) 1.7 MG/0.75ML SOAJ, Inject 1.7 mg into the skin once a week., Disp: 3 mL, Rfl: 1   No Known Allergies   Review of Systems  Constitutional: Negative.   Respiratory: Negative.  Negative for shortness of breath.   Cardiovascular: Negative.  Negative for chest pain.  Gastrointestinal: Negative.   Neurological: Negative.   Psychiatric/Behavioral: Negative.       Today's Vitals   11/11/21 0831  BP: 130/72  Pulse: 92  Temp: 97.9 F (36.6 C)  TempSrc: Oral  Weight: (!) 504 lb (228.6 kg)  Height: 5' 8.2" (1.732 m)   Body mass index is 76.18 kg/m.  Wt Readings from Last 3 Encounters:  11/11/21 (!) 504 lb (228.6 kg)  05/12/21 (!) 502 lb 12.8 oz (228.1 kg)  02/25/21 (!) 503 lb (228.2 kg)    Objective:  Physical Exam Vitals reviewed.  Constitutional:      General: He is not in acute distress.    Appearance: Normal appearance.  Cardiovascular:     Rate and Rhythm: Normal rate and regular rhythm.     Pulses: Normal pulses.     Heart sounds: Normal heart sounds. No murmur heard. Neurological:     General: No focal deficit present.     Mental Status: He is alert and  oriented to person, place, and time.     Cranial Nerves: No cranial nerve deficit.     Motor: No weakness.  Psychiatric:        Mood and Affect: Mood normal.        Behavior: Behavior normal.        Thought Content: Thought content normal.        Judgment: Judgment normal.         Assessment And Plan:     1. Prediabetes Comments: Continue metformin, will check HgbA1c. Encouraged to limit intake of sugary foods and drinks, carbohydrates - Hemoglobin A1c  2. Elevated lipids Comments: Continue statin, tolerating well. Increase physical activity and eat low fat diet - Lipid panel  3. Need for influenza vaccination Influenza vaccine administered Encouraged to take Tylenol as needed for fever or muscle aches. - Flu Vaccine QUAD 6+ mos PF IM (Fluarix Quad PF)  4. BMI 70 and over, adult [facility]) I have stressed once again my concern about his weight. Had a discussion about limiting his intake of carbs, explained which foods contain an increased amount of carbs. Encouraged him to make small changes to his diet. He is also advised to contact [facility] to have a consultation to get more information on their options for weight loss. I have referred him to weight management at least 3 times. We will try him on [facility] pending insurance approval. Discussed side effects to include nausea and constipation, no family history of medullary thyroid cancer.   - Semaglutide-Weight Management (WEGOVY) 1.7 MG/0.75ML SOAJ; Inject 1.7 mg into the skin once a week.  Dispense: 3 mL; Refill: 1    Patient was given opportunity to ask questions. Patient verbalized understanding of the plan and was able to repeat key elements of the plan. All questions were answered to their satisfaction.  Arnette Felts, FNP   I, Arnette Felts, FNP, have reviewed all documentation for this visit. The documentation on 11/11/21 for the exam, diagnosis, procedures, and orders are all accurate and complete.   IF YOU  HAVE BEEN REFERRED TO A SPECIALIST, IT MAY TAKE 1-2 WEEKS TO SCHEDULE/PROCESS THE REFERRAL. IF YOU HAVE NOT HEARD FROM US/SPECIALIST IN TWO WEEKS, PLEASE GIVE [place] A CALL AT [phone] X [phone].   THE PATIENT IS ENCOURAGED TO PRACTICE SOCIAL DISTANCING DUE TO THE COVID-19 PANDEMIC.

## 2021-11-12 LAB — HEMOGLOBIN A1C
Est. average glucose Bld gHb Est-mCnc: 123 mg/dL
Hgb A1c MFr Bld: 5.9 % — ABNORMAL HIGH (ref 4.8–5.6)

## 2021-11-12 LAB — LIPID PANEL
Chol/HDL Ratio: 4.4 ratio (ref 0.0–5.0)
Cholesterol, Total: 135 mg/dL (ref 100–199)
HDL: 31 mg/dL — ABNORMAL LOW (ref >39)
LDL Chol Calc (NIH): 73 mg/dL (ref 0–99)
Triglycerides: 185 mg/dL — ABNORMAL HIGH (ref 0–149)
VLDL Cholesterol Cal: 31 mg/dL (ref 5–40)

## 2021-11-20 ENCOUNTER — Telehealth: Payer: Self-pay

## 2021-11-20 NOTE — Telephone Encounter (Signed)
Plan Member Name: Ralph Cervantes Plan Member ID: [id number] Prescriber Name: Arnette Felts Prescriber Phone: [phone] Prescriber Fax: [phone] Dear [name]: [facility]  received a request from your provider for coverage of Wegovy (semaglutide injection). As long as you remain covered by the [facility] and there are no changes to your plan benefits, this request is approved for the following time period: 11/19/2021 - 06/20/2022 Approvals may be subject to dosing limits in accordance with FDA approved labeling, evidence-based practice guidelines or your prescription drug plan benefits

## 2021-11-22 ENCOUNTER — Encounter: Payer: Self-pay | Admitting: Nurse Practitioner

## 2021-11-23 ENCOUNTER — Other Ambulatory Visit: Payer: Self-pay | Admitting: Nurse Practitioner

## 2021-12-05 ENCOUNTER — Other Ambulatory Visit: Payer: Self-pay | Admitting: Nurse Practitioner

## 2021-12-05 DIAGNOSIS — L719 Rosacea, unspecified: Secondary | ICD-10-CM

## 2022-01-04 ENCOUNTER — Other Ambulatory Visit: Payer: Self-pay | Admitting: Nurse Practitioner

## 2022-02-03 ENCOUNTER — Ambulatory Visit: Payer: BC Managed Care – PPO | Admitting: Nurse Practitioner

## 2022-02-03 ENCOUNTER — Encounter: Payer: Self-pay | Admitting: Nurse Practitioner

## 2022-02-03 VITALS — BP 128/70 | HR 78 | Temp 97.9°F | Ht 68.2 in | Wt >= 6400 oz

## 2022-02-03 DIAGNOSIS — R7303 Prediabetes: Secondary | ICD-10-CM

## 2022-02-03 DIAGNOSIS — G4733 Obstructive sleep apnea (adult) (pediatric): Secondary | ICD-10-CM | POA: Diagnosis not present

## 2022-02-03 DIAGNOSIS — Z6841 Body Mass Index (BMI) 40.0 and over, adult: Secondary | ICD-10-CM | POA: Diagnosis not present

## 2022-02-03 MED ORDER — SEMAGLUTIDE (1 MG/DOSE) 4 MG/3ML ~~LOC~~ SOPN: 1.0000 mg | PEN_INJECTOR | SUBCUTANEOUS | 1 refills | Status: DC

## 2022-02-03 MED ORDER — WEGOVY 1 MG/0.5ML ~~LOC~~ SOAJ: 1.0000 mg | SUBCUTANEOUS | 1 refills | Status: DC

## 2022-02-03 NOTE — Patient Instructions (Signed)

## 2022-02-03 NOTE — Progress Notes (Signed)
I,Tianna Badgett,acting as a Neurosurgeon for [employer], FNP.,have documented all relevant documentation on the behalf of Arnette Felts, FNP,as directed by  Arnette Felts, FNP while in the presence of Arnette Felts, FNP.  Subjective:     Patient ID: Ralph Cervantes , male    DOB: 10-24-1975 , 46 y.o.   MRN: [id number]   Chief Complaint  Patient presents with   Prediabetes    HPI  Patient here for a f/u on his abnormal glucose and cholesterol. He has started the process with for bariatric surgery. He has been taking Ozempic 0.5 mg weekly due to having nausea/vomiting when increasing to 1 mg. He does eat pizza but is packing his foods while driving. 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 He has works in the yard. He has to do the application for the nutrition program [facility].   Wt Readings from Last 3 Encounters: 02/03/22 : (!) 502 lb (227.7 kg) 11/11/21 : (!) 504 lb (228.6 kg) 05/12/21 : (!) 502 lb 12.8 oz (228.1 kg)    Hyperlipidemia This is a chronic problem. The current episode started more than 1 month ago. The problem is uncontrolled. Recent lipid tests were reviewed and are variable. Exacerbating diseases include obesity. He has no history of chronic renal disease. prediabetes. There are no known factors aggravating his hyperlipidemia. Pertinent negatives include no chest pain or shortness of breath. Compliance problems include adherence to exercise, medication side effects and adherence to diet.  Risk factors for coronary artery disease include male sex, obesity, dyslipidemia and a sedentary lifestyle.     Past Medical History:  Diagnosis Date   GERD (gastroesophageal reflux disease)    Morbid obesity with BMI of 50.0-59.9, adult (HCC)    Obstructive sleep apnea    Pericarditis      Family History  Problem Relation Age of Onset   Cancer Father      Current Outpatient Medications:    albuterol (VENTOLIN HFA) 108 (90 Base) MCG/ACT inhaler, Inhale 2  puffs into the lungs every 6 (six) hours as needed for wheezing or shortness of breath., Disp: 18 g, Rfl: 2   atorvastatin (LIPITOR) 10 MG tablet, Take 1 tablet (10 mg total) by mouth daily., Disp: 30 tablet, Rfl: 11   doxycycline (MONODOX) 100 MG capsule, TAKE ONE CAPSULE BY MOUTH DAILY - MAY TAKE ONE CAPSULE BY MOUTH TWICE A DAY FOR EXACERBATIONS, Disp: 180 capsule, Rfl: 0   metFORMIN (GLUCOPHAGE XR) 500 MG 24 hr tablet, Take 1 tablet (500 mg total) by mouth daily with breakfast., Disp: 90 tablet, Rfl: 1   omeprazole (PRILOSEC) 40 MG capsule, TAKE ONE CAPSULE BY MOUTH DAILY, Disp: 90 capsule, Rfl: 1   Semaglutide-Weight Management (WEGOVY) 1 MG/0.5ML SOAJ, Inject 1 mg into the skin once a week., Disp: 2 mL, Rfl: 1   No Known Allergies   Review of Systems  Constitutional: Negative.   Respiratory: Negative.  Negative for shortness of breath and wheezing.   Cardiovascular: Negative.  Negative for chest pain, palpitations and leg swelling.  Gastrointestinal: Negative.   Neurological: Negative.   Psychiatric/Behavioral: Negative.       Today's Vitals   02/03/22 0841  BP: 128/70  Pulse: 78  Temp: 97.9 F (36.6 C)  TempSrc: Oral  Weight: (!) 502 lb (227.7 kg)  Height: 5' 8.2" (1.732 m)   Body mass index is 75.88 kg/m.  Wt Readings from Last 3 Encounters:  02/03/22 (!) 502 lb (227.7 kg)  11/11/21 (!) 504 lb (228.6 kg)  05/12/21 (!) 502 lb 12.8 oz (228.1 kg)    Objective:  Physical Exam Vitals reviewed.  Constitutional:      General: He is not in acute distress.    Appearance: Normal appearance. He is obese.     Comments: Super morbidly obese  Cardiovascular:     Rate and Rhythm: Normal rate and regular rhythm.     Pulses: Normal pulses.     Heart sounds: Normal heart sounds. No murmur heard. Pulmonary:     Effort: Pulmonary effort is normal. No respiratory distress.     Breath sounds: Normal breath sounds. No wheezing.  Skin:    General: Skin is warm and dry.      Capillary Refill: Capillary refill takes less than 2 seconds.  Neurological:     General: No focal deficit present.     Mental Status: He is alert and oriented to person, place, and time.     Cranial Nerves: No cranial nerve deficit.     Motor: No weakness.  Psychiatric:        Mood and Affect: Mood normal.        Behavior: Behavior normal.        Thought Content: Thought content normal.        Judgment: Judgment normal.         Assessment And Plan:     1. Prediabetes Comments: HgbA1c is stable. Continue current medications. Labs done in September  2. OSA on CPAP Comments: Doing well. No concerns  3. BMI 70 and over, adult [facility]) Comments: Will send Rx for [facility]. He has tried diet and exercise for weight loss which has been ineffective. Encouraged to continue with [facility].  I have called [facility] weight management and they said he needs to watch the seminar that the link was sent to his phone and he has accessed already once he has completed this he is to call their office to schedule an appt for a consultation. He is encouraged to initially strive for BMI less than 30 to decrease cardiac risk. He is advised to exercise no less than 150 minutes per week.  - Semaglutide-Weight Management (WEGOVY) 1 MG/0.5ML SOAJ; Inject 1 mg into the skin once a week.  Dispense: 2 mL; Refill: 1   Patient was given opportunity to ask questions. Patient verbalized understanding of the plan and was able to repeat key elements of the plan. All questions were answered to their satisfaction.  Arnette Felts, FNP   I, Arnette Felts, FNP, have reviewed all documentation for this visit. The documentation on 02/03/22 for the exam, diagnosis, procedures, and orders are all accurate and complete.   IF YOU HAVE BEEN REFERRED TO A SPECIALIST, IT MAY TAKE 1-2 WEEKS TO SCHEDULE/PROCESS THE REFERRAL. IF YOU HAVE NOT HEARD FROM US/SPECIALIST IN TWO WEEKS, PLEASE GIVE [place] A CALL AT [phone] X  [phone].   THE PATIENT IS ENCOURAGED TO PRACTICE SOCIAL DISTANCING DUE TO THE COVID-19 PANDEMIC.

## 2022-02-24 ENCOUNTER — Encounter: Payer: Self-pay | Admitting: Nurse Practitioner

## 2022-03-04 ENCOUNTER — Other Ambulatory Visit: Payer: Self-pay | Admitting: Nurse Practitioner

## 2022-03-04 DIAGNOSIS — G4733 Obstructive sleep apnea (adult) (pediatric): Secondary | ICD-10-CM

## 2022-03-04 DIAGNOSIS — Z6841 Body Mass Index (BMI) 40.0 and over, adult: Secondary | ICD-10-CM

## 2022-04-01 ENCOUNTER — Other Ambulatory Visit: Payer: Self-pay | Admitting: Nurse Practitioner

## 2022-04-19 ENCOUNTER — Other Ambulatory Visit: Payer: Self-pay | Admitting: Nurse Practitioner

## 2022-05-12 ENCOUNTER — Ambulatory Visit: Payer: BC Managed Care – PPO | Admitting: Nurse Practitioner

## 2022-05-12 ENCOUNTER — Encounter: Payer: Self-pay | Admitting: Nurse Practitioner

## 2022-05-12 DIAGNOSIS — L918 Other hypertrophic disorders of the skin: Secondary | ICD-10-CM | POA: Insufficient documentation

## 2022-05-17 ENCOUNTER — Encounter: Payer: BC Managed Care – PPO | Admitting: Nurse Practitioner

## 2022-05-20 ENCOUNTER — Other Ambulatory Visit: Payer: Self-pay | Admitting: Nurse Practitioner

## 2023-04-13 ENCOUNTER — Encounter: Payer: Self-pay | Admitting: Nurse Practitioner

## 2023-04-13 ENCOUNTER — Telehealth (INDEPENDENT_AMBULATORY_CARE_PROVIDER_SITE_OTHER): Payer: 59 | Admitting: Nurse Practitioner

## 2023-04-13 VITALS — Ht 70.0 in | Wt >= 6400 oz

## 2023-04-13 DIAGNOSIS — R7303 Prediabetes: Secondary | ICD-10-CM | POA: Diagnosis not present

## 2023-04-13 DIAGNOSIS — F5102 Adjustment insomnia: Secondary | ICD-10-CM | POA: Diagnosis not present

## 2023-04-13 DIAGNOSIS — F4321 Adjustment disorder with depressed mood: Secondary | ICD-10-CM

## 2023-04-13 DIAGNOSIS — G4733 Obstructive sleep apnea (adult) (pediatric): Secondary | ICD-10-CM

## 2023-04-13 DIAGNOSIS — E785 Hyperlipidemia, unspecified: Secondary | ICD-10-CM | POA: Insufficient documentation

## 2023-04-13 DIAGNOSIS — Z79899 Other long term (current) drug therapy: Secondary | ICD-10-CM

## 2023-04-13 DIAGNOSIS — Z6841 Body Mass Index (BMI) 40.0 and over, adult: Secondary | ICD-10-CM

## 2023-04-13 MED ORDER — TRAZODONE HCL 50 MG PO TABS: 50.0000 mg | ORAL_TABLET | Freq: Every evening | ORAL | 1 refills | Status: AC | PRN

## 2023-04-13 NOTE — Assessment & Plan Note (Signed)
 He is having difficulty with his sleep due to his recent passing of his wife.  I have advised him not to take other peoples medications especially Ambien as it can have adverse reactions.  I will try to send in prescription for trazodone  he may take 1 tablet and may repeat in 1 hour if he does not go to sleep.  If this is not effective he is to return call to the office and we can consider changing medications.

## 2023-04-13 NOTE — Progress Notes (Signed)
 Virtual Visit via Video Note  LILLETTE Kristeen JINNY Gladis, CMA,acting as a scribe for Gaines Ada, FNP.,have documented all relevant documentation on the behalf of Gaines Ada, FNP,as directed by  Gaines Ada, FNP while in the presence of Gaines Ada, FNP.  I connected with Ralph Cervantes on 04/13/23 at  9:40 AM EST by a video enabled telemedicine application and verified that I am speaking with the correct person using two identifiers.  Patient Location: Home Provider Location: Office/Clinic  I discussed the limitations, risks, security, and privacy concerns of performing an evaluation and management service by video and the availability of in person appointments. I also discussed with the patient that there may be a patient responsible charge related to this service. The patient expressed understanding and agreed to proceed.  Subjective: PCP: Ada Gaines, FNP  No chief complaint on file.  Patient presents today for not being able to sleep his wife passed away March 02, 2024. He is having trouble sleeping. He reports his brother gave him an ambien and he was able to sleep. He has a lot on his mind and taking over the counter sleeping pills. He has been out of work for the last 3 months to assist caring for his wife who had terminal cancer.    He is using a CPAP and feels like he needs supplies. He needs mask, tubing and humdifier bottle from [employer].   He had stopped taking the metformin  in a while. Was taking Ozempic  until he did not get any additional refills. He has not been seen in the office since November 2023. He only takes his doxycycline  and prilosec as needed.     Review of Systems  Constitutional: Negative.   Respiratory: Negative.    Cardiovascular: Negative.   Musculoskeletal: Negative.   Neurological: Negative.   Psychiatric/Behavioral:  The patient has insomnia.      Current Outpatient Medications:    doxycycline  (MONODOX ) 100 MG capsule, TAKE ONE CAPSULE BY MOUTH DAILY -  MAY TAKE ONE CAPSULE BY MOUTH TWICE A DAY FOR EXACERBATIONS, Disp: 180 capsule, Rfl: 0   omeprazole  (PRILOSEC) 40 MG capsule, TAKE ONE CAPSULE BY MOUTH DAILY, Disp: 90 capsule, Rfl: 1   traZODone  (DESYREL ) 50 MG tablet, Take 1 tablet (50 mg total) by mouth at bedtime as needed for sleep. May repeat if not sleep in 1 hour, Disp: 30 tablet, Rfl: 1   albuterol  (VENTOLIN  HFA) 108 (90 Base) MCG/ACT inhaler, Inhale 2 puffs into the lungs every 6 (six) hours as needed for wheezing or shortness of breath. (Patient not taking: Reported on 04/13/2023), Disp: 18 g, Rfl: 2   atorvastatin  (LIPITOR) 10 MG tablet, TAKE ONE TABLET BY MOUTH DAILY (Patient not taking: Reported on 04/13/2023), Disp: 30 tablet, Rfl: 11   metFORMIN  (GLUCOPHAGE  XR) 500 MG 24 hr tablet, Take 1 tablet (500 mg total) by mouth daily with breakfast. (Patient not taking: Reported on 04/13/2023), Disp: 90 tablet, Rfl: 1   OZEMPIC , 1 MG/DOSE, 4 MG/3ML SOPN, Inject 1 mg into the skin once a week. (Patient not taking: Reported on 04/13/2023), Disp: , Rfl:   Observations/Objective: Today's Vitals   04/13/23 0956  Weight: (!) 502 lb (227.7 kg)  Height: 5' 10 (1.778 m)  Body mass index is 72.03 kg/m.  Physical Exam Vitals reviewed.  Constitutional:      General: He is not in acute distress.    Appearance: Normal appearance. He is obese.     Comments: Super morbidly obese  Pulmonary:  Effort: Pulmonary effort is normal. No respiratory distress.  Neurological:     General: No focal deficit present.     Mental Status: He is alert and oriented to person, place, and time.     Cranial Nerves: No cranial nerve deficit.  Psychiatric:        Mood and Affect: Mood normal.        Behavior: Behavior normal.        Thought Content: Thought content normal.        Judgment: Judgment normal.     Assessment and Plan: Adjustment insomnia Assessment & Plan: He is having difficulty with his sleep due to his recent passing of his wife.  I have advised  him not to take other peoples medications especially Ambien as it can have adverse reactions.  I will try to send in prescription for trazodone  he may take 1 tablet and may repeat in 1 hour if he does not go to sleep.  If this is not effective he is to return call to the office and we can consider changing medications.  Orders: -     traZODone  HCl; Take 1 tablet (50 mg total) by mouth at bedtime as needed for sleep. May repeat if not sleep in 1 hour  Dispense: 30 tablet; Refill: 1  Grief Assessment & Plan: He feels like overall he is doing okay his wife was on hospice care for about a month.  Orders: -     traZODone  HCl; Take 1 tablet (50 mg total) by mouth at bedtime as needed for sleep. May repeat if not sleep in 1 hour  Dispense: 30 tablet; Refill: 1  Elevated lipids Assessment & Plan: Will check a fasting lipid.  He has not been taking any medications.  Orders: -     Lipid panel; Future -     CMP14+EGFR; Future  Prediabetes Assessment & Plan: Will check a hemoglobin A1c.  He has not been taking metformin  for over a year.  He thinks that was related to GI upset but was not sure.  He was also not taking Ozempic  but will be willing to start back depending on his hemoglobin A1c  Orders: -     Hemoglobin A1c; Future  OSA on CPAP Assessment & Plan: Continues to use his CPAP and benefits from wearing nightly.  Improves his quality of life.  He is in need of supplies will order from [employer].   Other long term (current) drug therapy -     CBC with Differential/Platelet; Future  Morbid obesity (HCC) Assessment & Plan: he is encouraged to strive for BMI less than 30 to decrease cardiac risk. Advised to aim for at least 150 minutes of exercise per week.      Follow Up Instructions: Return for fasting labs tomorrow; 4 month f/u and HM.   I discussed the assessment and treatment plan with the patient. The patient was provided an opportunity to ask questions, and all were answered.  The patient agreed with the plan and demonstrated an understanding of the instructions.   The patient was advised to call back or seek an in-person evaluation if the symptoms worsen or if the condition fails to improve as anticipated.  The above assessment and management plan was discussed with the patient. The patient verbalized understanding of and has agreed to the management plan.   LILLETTE Gaines Ada, FNP, have reviewed all documentation for this visit. The documentation on 04/13/23 for the exam, diagnosis, procedures, and orders are all  accurate and complete.

## 2023-04-13 NOTE — Assessment & Plan Note (Signed)
 Will check a fasting lipid.  He has not been taking any medications.

## 2023-04-13 NOTE — Assessment & Plan Note (Signed)
 Continues to use his CPAP and benefits from wearing nightly.  Improves his quality of life.  He is in need of supplies will order from [employer].

## 2023-04-13 NOTE — Assessment & Plan Note (Signed)
 he is encouraged to strive for BMI less than 30 to decrease cardiac risk. Advised to aim for at least 150 minutes of exercise per week.

## 2023-04-13 NOTE — Assessment & Plan Note (Signed)
 He feels like overall he is doing okay his wife was on hospice care for about a month.

## 2023-04-13 NOTE — Assessment & Plan Note (Signed)
 Will check a hemoglobin A1c.  He has not been taking metformin  for over a year.  He thinks that was related to GI upset but was not sure.  He was also not taking Ozempic  but will be willing to start back depending on his hemoglobin A1c

## 2023-04-14 ENCOUNTER — Other Ambulatory Visit: Payer: 59

## 2023-04-14 DIAGNOSIS — E785 Hyperlipidemia, unspecified: Secondary | ICD-10-CM

## 2023-04-14 DIAGNOSIS — Z79899 Other long term (current) drug therapy: Secondary | ICD-10-CM

## 2023-04-14 DIAGNOSIS — R7303 Prediabetes: Secondary | ICD-10-CM

## 2023-04-15 LAB — CMP14+EGFR
ALT: 31 [IU]/L (ref 0–44)
AST: 19 [IU]/L (ref 0–40)
Albumin: 4 g/dL — ABNORMAL LOW (ref 4.1–5.1)
Alkaline Phosphatase: 85 [IU]/L (ref 44–121)
BUN/Creatinine Ratio: 20 (ref 9–20)
BUN: 20 mg/dL (ref 6–24)
Bilirubin Total: 0.3 mg/dL (ref 0.0–1.2)
CO2: 23 mmol/L (ref 20–29)
Calcium: 9.1 mg/dL (ref 8.7–10.2)
Chloride: 102 mmol/L (ref 96–106)
Creatinine, Ser: 1 mg/dL (ref 0.76–1.27)
Globulin, Total: 2.8 g/dL (ref 1.5–4.5)
Glucose: 108 mg/dL — ABNORMAL HIGH (ref 70–99)
Potassium: 4.5 mmol/L (ref 3.5–5.2)
Sodium: 142 mmol/L (ref 134–144)
Total Protein: 6.8 g/dL (ref 6.0–8.5)
eGFR: 92 mL/min/{1.73_m2} (ref >59)

## 2023-04-15 LAB — CBC WITH DIFFERENTIAL/PLATELET
Basophils Absolute: 0 10*3/uL (ref 0.0–0.2)
Basos: 1 %
EOS (ABSOLUTE): 0.1 10*3/uL (ref 0.0–0.4)
Eos: 1 %
Hematocrit: 45.9 % (ref 37.5–51.0)
Hemoglobin: 14.8 g/dL (ref 13.0–17.7)
Immature Grans (Abs): 0 10*3/uL (ref 0.0–0.1)
Immature Granulocytes: 0 %
Lymphocytes Absolute: 2.3 10*3/uL (ref 0.7–3.1)
Lymphs: 32 %
MCH: 29.4 pg (ref 26.6–33.0)
MCHC: 32.2 g/dL (ref 31.5–35.7)
MCV: 91 fL (ref 79–97)
Monocytes Absolute: 0.6 10*3/uL (ref 0.1–0.9)
Monocytes: 9 %
Neutrophils Absolute: 4.1 10*3/uL (ref 1.4–7.0)
Neutrophils: 57 %
Platelets: 218 10*3/uL (ref 150–450)
RBC: 5.04 x10E6/uL (ref 4.14–5.80)
RDW: 12.8 % (ref 11.6–15.4)
WBC: 7.2 10*3/uL (ref 3.4–10.8)

## 2023-04-15 LAB — LIPID PANEL
Chol/HDL Ratio: 4.9 {ratio} (ref 0.0–5.0)
Cholesterol, Total: 143 mg/dL (ref 100–199)
HDL: 29 mg/dL — ABNORMAL LOW (ref >39)
LDL Chol Calc (NIH): 91 mg/dL (ref 0–99)
Triglycerides: 129 mg/dL (ref 0–149)
VLDL Cholesterol Cal: 23 mg/dL (ref 5–40)

## 2023-04-15 LAB — HEMOGLOBIN A1C
Est. average glucose Bld gHb Est-mCnc: 126 mg/dL
Hgb A1c MFr Bld: 6 % — ABNORMAL HIGH (ref 4.8–5.6)

## 2023-04-19 ENCOUNTER — Other Ambulatory Visit: Payer: Self-pay | Admitting: Nurse Practitioner

## 2023-04-20 ENCOUNTER — Encounter: Payer: Self-pay | Admitting: Nurse Practitioner

## 2023-04-27 ENCOUNTER — Other Ambulatory Visit: Payer: Self-pay | Admitting: Nurse Practitioner

## 2023-05-09 ENCOUNTER — Other Ambulatory Visit: Payer: Self-pay | Admitting: Nurse Practitioner

## 2023-05-09 DIAGNOSIS — L719 Rosacea, unspecified: Secondary | ICD-10-CM

## 2023-05-09 DIAGNOSIS — F5102 Adjustment insomnia: Secondary | ICD-10-CM

## 2023-05-09 DIAGNOSIS — F4321 Adjustment disorder with depressed mood: Secondary | ICD-10-CM

## 2023-05-09 MED ORDER — DOXYCYCLINE MONOHYDRATE 100 MG PO CAPS: ORAL_CAPSULE | ORAL | 1 refills | Status: AC

## 2023-05-09 MED ORDER — OMEPRAZOLE 40 MG PO CPDR
40.0000 mg | DELAYED_RELEASE_CAPSULE | Freq: Every day | ORAL | 1 refills | Status: DC
Start: 2023-05-09 — End: 2023-10-31

## 2023-07-06 ENCOUNTER — Ambulatory Visit: Admitting: Family Medicine

## 2023-07-06 ENCOUNTER — Encounter: Payer: Self-pay | Admitting: Family Medicine

## 2023-07-06 VITALS — BP 134/80 | HR 102 | Temp 99.3°F | Ht 70.0 in | Wt >= 6400 oz

## 2023-07-06 DIAGNOSIS — M722 Plantar fascial fibromatosis: Secondary | ICD-10-CM | POA: Diagnosis not present

## 2023-07-06 DIAGNOSIS — R1084 Generalized abdominal pain: Secondary | ICD-10-CM | POA: Diagnosis not present

## 2023-07-06 DIAGNOSIS — Z6841 Body Mass Index (BMI) 40.0 and over, adult: Secondary | ICD-10-CM

## 2023-07-06 DIAGNOSIS — E782 Mixed hyperlipidemia: Secondary | ICD-10-CM | POA: Diagnosis not present

## 2023-07-06 MED ORDER — ATORVASTATIN CALCIUM 10 MG PO TABS: 10.0000 mg | ORAL_TABLET | Freq: Every day | ORAL | 11 refills | Status: AC

## 2023-07-06 MED ORDER — METHOCARBAMOL 750 MG PO TABS: 750.0000 mg | ORAL_TABLET | Freq: Three times a day (TID) | ORAL | 0 refills | Status: AC

## 2023-07-06 NOTE — Progress Notes (Signed)
 I,Jameka J Llittleton, CMA,acting as a Neurosurgeon for [employer], NP.,have documented all relevant documentation on the behalf of Melodie Spry, NP,as directed by  Melodie Spry, NP while in the presence of Melodie Spry, NP.  Subjective:  Patient ID: Ralph Cervantes , male    DOB: [birth year] , 48 y.o.   MRN: [id number]  Chief Complaint  Patient presents with   Abdominal Pain    HPI  Patient is a 48 year old male who presents today for abdominal pain. He reports the pain is around his lower abdominal area and it started last night at work. He works in the [employer] and he said he had to pull a bulk load onto his truck yesterday and afterwards, he started feeling the pain, he is not sure if he pulled a muscle. Will give him a muscle relaxant and get CT abdomen, patient already has umbilical hernia, just to make sure it has not gotten worse.      Past Medical History:  Diagnosis Date   GERD (gastroesophageal reflux disease)    Morbid obesity with BMI of 50.0-59.9, adult (HCC)    Obstructive sleep apnea    Pericarditis      Family History  Problem Relation Age of Onset   Cancer Father      Current Outpatient Medications:    doxycycline  (MONODOX ) 100 MG capsule, TAKE ONE CAPSULE BY MOUTH DAILY - MAY TAKE ONE CAPSULE BY MOUTH TWICE A DAY FOR EXACERBATIONS, Disp: 180 capsule, Rfl: 1   methocarbamol  (ROBAXIN ) 750 MG tablet, Take 1 tablet (750 mg total) by mouth 3 (three) times daily., Disp: 30 tablet, Rfl: 0   omeprazole  (PRILOSEC) 40 MG capsule, Take 1 capsule (40 mg total) by mouth daily., Disp: 90 capsule, Rfl: 1   albuterol  (VENTOLIN  HFA) 108 (90 Base) MCG/ACT inhaler, Inhale 2 puffs into the lungs every 6 (six) hours as needed for wheezing or shortness of breath. (Patient not taking: Reported on 07/06/2023), Disp: 18 g, Rfl: 2   atorvastatin  (LIPITOR) 10 MG tablet, Take 1 tablet (10 mg total) by mouth daily., Disp: 30 tablet, Rfl: 11   metFORMIN  (GLUCOPHAGE  XR) 500 MG 24 hr tablet, Take 1  tablet (500 mg total) by mouth daily with breakfast. (Patient not taking: Reported on 04/13/2023), Disp: 90 tablet, Rfl: 1   OZEMPIC , 1 MG/DOSE, 4 MG/3ML SOPN, Inject 1 mg into the skin once a week. (Patient not taking: Reported on 07/06/2023), Disp: , Rfl:    traZODone  (DESYREL ) 50 MG tablet, Take 1 tablet (50 mg total) by mouth at bedtime as needed for sleep. May repeat if not sleep in 1 hour (Patient not taking: Reported on 07/06/2023), Disp: 30 tablet, Rfl: 1   No Known Allergies   Review of Systems  Constitutional: Negative.   HENT: Negative.    Eyes: Negative.   Respiratory: Negative.    Cardiovascular: Negative.   Gastrointestinal:  Positive for abdominal pain.  Endocrine: Negative.   Musculoskeletal: Negative.   Skin: Negative.      Today's Vitals   07/06/23 1606  BP: 134/80  Pulse: (!) 102  Temp: 99.3 F (37.4 C)  TempSrc: Oral  Weight: (!) 505 lb (229.1 kg)  Height: 5\' 10"  (1.778 m)  PainSc: 3   PainLoc: Abdomen   Body mass index is 72.46 kg/m.  Wt Readings from Last 3 Encounters:  07/06/23 (!) 505 lb (229.1 kg)  04/13/23 (!) 502 lb (227.7 kg)  02/03/22 (!) 502 lb (227.7 kg)    The  10-year ASCVD risk score (Arnett DK, et al., 2019) is: 3.7%   Values used to calculate the score:     Age: 20 years     Sex: Male     Is Non-Hispanic African American: No     Diabetic: No     Tobacco smoker: No     Systolic Blood Pressure: 134 mmHg     Is BP treated: No     HDL Cholesterol: 29 mg/dL     Total Cholesterol: 143 mg/dL  Objective:  Physical Exam HENT:     Head: Normocephalic.  Cardiovascular:     Rate and Rhythm: Tachycardia present.  Abdominal:     Tenderness: There is abdominal tenderness.  Neurological:     Mental Status: He is alert.         Assessment And Plan:  Generalized abdominal pain -     Methocarbamol ; Take 1 tablet (750 mg total) by mouth 3 (three) times daily.  Dispense: 30 tablet; Refill: 0 -     CT ABDOMEN PELVIS W CONTRAST;  Future  Plantar fasciitis Assessment & Plan: Stretches advised, shoe inserts encouraged.    BMI 70 and over, adult [facility]) Assessment & Plan: He is encouraged to strive for BMI less than 30 to decrease cardiac risk. Advised to aim for at least 150 minutes of exercise per week.    Mixed hyperlipidemia -     Atorvastatin  Calcium ; Take 1 tablet (10 mg total) by mouth daily.  Dispense: 30 tablet; Refill: 11    Return if symptoms worsen or fail to improve, for keep next appt when avail.  Patient was given opportunity to ask questions. Patient verbalized understanding of the plan and was able to repeat key elements of the plan. All questions were answered to their satisfaction.  I, Melodie Spry, NP, have reviewed all documentation for this visit. The documentation on 07/11/2023 for the exam, diagnosis, procedures, and orders are all accurate and complete.    IF YOU HAVE BEEN REFERRED TO A SPECIALIST, IT MAY TAKE 1-2 WEEKS TO SCHEDULE/PROCESS THE REFERRAL. IF YOU HAVE NOT HEARD FROM US /SPECIALIST IN TWO WEEKS, PLEASE GIVE US  A CALL AT [phone].

## 2023-07-11 NOTE — Assessment & Plan Note (Signed)
 He is encouraged to strive for BMI less than 30 to decrease cardiac risk. Advised to aim for at least 150 minutes of exercise per week.

## 2023-07-11 NOTE — Assessment & Plan Note (Signed)
 Stretches advised, shoe inserts encouraged.

## 2023-10-31 ENCOUNTER — Other Ambulatory Visit: Payer: Self-pay | Admitting: Nurse Practitioner

## 2023-11-27 ENCOUNTER — Other Ambulatory Visit: Payer: Self-pay | Admitting: Nurse Practitioner
